# Patient Record
Sex: Female | Born: 1950
Health system: Southern US, Community
[De-identification: ages and names within clinical notes are randomized; demographics above are authoritative.]

## PROBLEM LIST (undated history)

## (undated) DIAGNOSIS — I639 Cerebral infarction, unspecified: Secondary | ICD-10-CM

## (undated) DIAGNOSIS — E162 Hypoglycemia, unspecified: Secondary | ICD-10-CM

## (undated) DIAGNOSIS — I10 Essential (primary) hypertension: Secondary | ICD-10-CM

## (undated) HISTORY — PX: ABDOMINAL HYSTERECTOMY: SHX81

## (undated) HISTORY — PX: HERNIA REPAIR: SHX51

## (undated) HISTORY — PX: TUBAL LIGATION: SHX77

---

## 2011-07-20 ENCOUNTER — Emergency Department (INDEPENDENT_AMBULATORY_CARE_PROVIDER_SITE_OTHER): Payer: No Typology Code available for payment source

## 2011-07-20 ENCOUNTER — Emergency Department (HOSPITAL_BASED_OUTPATIENT_CLINIC_OR_DEPARTMENT_OTHER)
Admission: EM | Admit: 2011-07-20 | Discharge: 2011-07-20 | Disposition: A | Discharge status: Home / Self Care | Payer: No Typology Code available for payment source | Attending: Emergency Medicine | Admitting: Emergency Medicine

## 2011-07-20 ENCOUNTER — Encounter: Payer: Self-pay | Admitting: *Deleted

## 2011-07-20 DIAGNOSIS — R109 Unspecified abdominal pain: Secondary | ICD-10-CM | POA: Insufficient documentation

## 2011-07-20 DIAGNOSIS — Y92410 Unspecified street and highway as the place of occurrence of the external cause: Secondary | ICD-10-CM | POA: Insufficient documentation

## 2011-07-20 DIAGNOSIS — M549 Dorsalgia, unspecified: Secondary | ICD-10-CM | POA: Insufficient documentation

## 2011-07-20 DIAGNOSIS — M25559 Pain in unspecified hip: Secondary | ICD-10-CM

## 2011-07-20 DIAGNOSIS — I10 Essential (primary) hypertension: Secondary | ICD-10-CM | POA: Insufficient documentation

## 2011-07-20 HISTORY — DX: Essential (primary) hypertension: I10
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
## 2011-07-23 ENCOUNTER — Ambulatory Visit (INDEPENDENT_AMBULATORY_CARE_PROVIDER_SITE_OTHER): Payer: No Typology Code available for payment source | Admitting: Family Medicine

## 2011-07-23 ENCOUNTER — Encounter: Payer: Self-pay | Admitting: Family Medicine

## 2011-07-23 ENCOUNTER — Ambulatory Visit (HOSPITAL_BASED_OUTPATIENT_CLINIC_OR_DEPARTMENT_OTHER)
Admission: RE | Admit: 2011-07-23 | Discharge: 2011-07-23 | Disposition: A | Discharge status: Home / Self Care | Payer: No Typology Code available for payment source | Source: Ambulatory Visit | Attending: Family Medicine | Admitting: Family Medicine

## 2011-07-23 DIAGNOSIS — M25571 Pain in right ankle and joints of right foot: Secondary | ICD-10-CM

## 2011-07-23 DIAGNOSIS — S93401A Sprain of unspecified ligament of right ankle, initial encounter: Secondary | ICD-10-CM

## 2011-07-23 DIAGNOSIS — M79641 Pain in right hand: Secondary | ICD-10-CM

## 2011-07-23 DIAGNOSIS — M79609 Pain in unspecified limb: Secondary | ICD-10-CM

## 2011-07-23 DIAGNOSIS — M25579 Pain in unspecified ankle and joints of unspecified foot: Secondary | ICD-10-CM

## 2011-07-23 DIAGNOSIS — M25476 Effusion, unspecified foot: Secondary | ICD-10-CM | POA: Insufficient documentation

## 2011-07-23 DIAGNOSIS — IMO0002 Reserved for concepts with insufficient information to code with codable children: Secondary | ICD-10-CM

## 2011-07-23 DIAGNOSIS — M7730 Calcaneal spur, unspecified foot: Secondary | ICD-10-CM | POA: Insufficient documentation

## 2011-07-23 DIAGNOSIS — M25539 Pain in unspecified wrist: Secondary | ICD-10-CM

## 2011-07-23 DIAGNOSIS — M25531 Pain in right wrist: Secondary | ICD-10-CM

## 2011-07-23 DIAGNOSIS — S93409A Sprain of unspecified ligament of unspecified ankle, initial encounter: Secondary | ICD-10-CM

## 2011-07-23 DIAGNOSIS — T07XXXA Unspecified multiple injuries, initial encounter: Secondary | ICD-10-CM

## 2011-07-23 DIAGNOSIS — M25473 Effusion, unspecified ankle: Secondary | ICD-10-CM | POA: Insufficient documentation

## 2011-07-23 DIAGNOSIS — S63509A Unspecified sprain of unspecified wrist, initial encounter: Secondary | ICD-10-CM

## 2011-07-23 MED ORDER — MELOXICAM 15 MG PO TABS: 15.0000 mg | ORAL_TABLET | Freq: Every day | ORAL | Status: AC

## 2011-07-23 MED ORDER — HYDROCODONE-ACETAMINOPHEN 5-500 MG PO TABS: 1.0000 | ORAL_TABLET | Freq: Four times a day (QID) | ORAL | Status: AC | PRN

## 2011-07-23 MED ORDER — CYCLOBENZAPRINE HCL 10 MG PO TABS: 10.0000 mg | ORAL_TABLET | Freq: Three times a day (TID) | ORAL | Status: AC | PRN
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
## 2011-07-24 ENCOUNTER — Encounter: Payer: Self-pay | Admitting: Family Medicine

## 2011-07-24 DIAGNOSIS — S93401A Sprain of unspecified ligament of right ankle, initial encounter: Secondary | ICD-10-CM | POA: Insufficient documentation

## 2011-07-24 DIAGNOSIS — S63509A Unspecified sprain of unspecified wrist, initial encounter: Secondary | ICD-10-CM | POA: Insufficient documentation

## 2011-07-24 DIAGNOSIS — T07XXXA Unspecified multiple injuries, initial encounter: Secondary | ICD-10-CM | POA: Insufficient documentation
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
## 2011-07-24 NOTE — Assessment & Plan Note (Signed)
x-rays of lumbar spine and left hip negative.  Muscle soreness throughout left side of back but primarily in cervical and lumbar regions.  Start physical therapy and home exercise program.  Tylenol, meloxicam for pain.  Flexeril at nighttime for muscle spasms.  Heat as well as needed.  Vicodin prn pain at bedtime.
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
## 2011-07-24 NOTE — Assessment & Plan Note (Signed)
mild - walking without a limp.  X-rays negative for fracture at lateral malleolus.  Will monitor - believe this is a grade 1 sprain - consider ASO, rehab for this as well if she doesn't improve as expected.  Mobic, icing.

## 2011-07-31 ENCOUNTER — Ambulatory Visit: Payer: No Typology Code available for payment source | Attending: Family Medicine | Admitting: Physical Therapy

## 2011-07-31 DIAGNOSIS — M545 Low back pain, unspecified: Secondary | ICD-10-CM | POA: Insufficient documentation

## 2011-07-31 DIAGNOSIS — IMO0001 Reserved for inherently not codable concepts without codable children: Secondary | ICD-10-CM | POA: Insufficient documentation

## 2011-07-31 DIAGNOSIS — M542 Cervicalgia: Secondary | ICD-10-CM | POA: Insufficient documentation

## 2012-03-18 ENCOUNTER — Emergency Department (HOSPITAL_BASED_OUTPATIENT_CLINIC_OR_DEPARTMENT_OTHER)
Admission: EM | Admit: 2012-03-18 | Discharge: 2012-03-18 | Disposition: A | Discharge status: Home / Self Care | Payer: Self-pay | Attending: Emergency Medicine | Admitting: Emergency Medicine

## 2012-03-18 ENCOUNTER — Encounter (HOSPITAL_BASED_OUTPATIENT_CLINIC_OR_DEPARTMENT_OTHER): Payer: Self-pay | Admitting: *Deleted

## 2012-03-18 DIAGNOSIS — N390 Urinary tract infection, site not specified: Secondary | ICD-10-CM

## 2012-03-18 DIAGNOSIS — IMO0001 Reserved for inherently not codable concepts without codable children: Secondary | ICD-10-CM | POA: Insufficient documentation

## 2012-03-18 DIAGNOSIS — I10 Essential (primary) hypertension: Secondary | ICD-10-CM | POA: Insufficient documentation

## 2012-03-18 DIAGNOSIS — M5410 Radiculopathy, site unspecified: Secondary | ICD-10-CM

## 2012-03-18 LAB — URINALYSIS, ROUTINE W REFLEX MICROSCOPIC
Bilirubin Urine: NEGATIVE
Hgb urine dipstick: NEGATIVE
Ketones, ur: NEGATIVE mg/dL
Nitrite: NEGATIVE
Protein, ur: NEGATIVE mg/dL
Urobilinogen, UA: 1 mg/dL (ref 0.0–1.0)

## 2012-03-18 LAB — URINE MICROSCOPIC-ADD ON

## 2012-03-18 MED ORDER — CIPROFLOXACIN HCL 250 MG PO TABS: 250.0000 mg | ORAL_TABLET | Freq: Two times a day (BID) | ORAL | Status: AC

## 2012-03-18 MED ORDER — PREDNISONE 10 MG PO TABS: 20.0000 mg | ORAL_TABLET | Freq: Two times a day (BID) | ORAL | Status: DC

## 2012-03-18 MED ORDER — HYDROCODONE-ACETAMINOPHEN 5-500 MG PO TABS: 1.0000 | ORAL_TABLET | Freq: Four times a day (QID) | ORAL | Status: AC | PRN

## 2012-03-18 NOTE — ED Notes (Signed)
Patient states she has a three week history of right lumbar back pain with radiation into legs, left leg worse than right.  States she has swelling in her left foot.  No known injury.  Using ibuprofen 800mg  tid and occasionally goody powder with minimal relief.

## 2012-03-18 NOTE — ED Provider Notes (Signed)
History     CSN: [id number]  Arrival date & time 03/18/12  1138   First MD Initiated Contact with Patient 03/18/12 1223      Chief Complaint  Patient presents with  . Back Pain  . Leg Pain  . Hematuria    (Consider location/radiation/quality/duration/timing/severity/associated sxs/prior treatment) Patient is a 61 y.o. female presenting with back pain. The history is provided by the patient.  Back Pain  This is a new problem. Episode onset: 2-3 weeks ago. The problem occurs constantly. The problem has been gradually worsening. The pain is associated with no known injury. The pain is present in the lumbar spine. The quality of the pain is described as stabbing. The pain radiates to the right thigh. The pain is moderate. The symptoms are aggravated by bending, twisting and certain positions. The pain is the same all the time. Pertinent negatives include no bowel incontinence, no bladder incontinence and no weakness. She has tried NSAIDs for the symptoms. The treatment provided no relief.    Past Medical History  Diagnosis Date  . Hypertension     Past Surgical History  Procedure Date  . Abdominal hysterectomy   . Hernia repair   . Tubal ligation     Family History  Problem Relation Age of Onset  . Hypertension Mother   . Diabetes Sister   . Hypertension Brother   . Hypertension Maternal Aunt   . Heart attack Neg Hx   . Hyperlipidemia Neg Hx   . Sudden death Neg Hx     History  Substance Use Topics  . Smoking status: Never Smoker   . Smokeless tobacco: Not on file  . Alcohol Use: No    OB History    Grav Para Term Preterm Abortions TAB SAB Ect Mult Living                  Review of Systems  Gastrointestinal: Negative for bowel incontinence.  Genitourinary: Negative for bladder incontinence.  Musculoskeletal: Positive for back pain.  Neurological: Negative for weakness.  All other systems reviewed and are negative.    Allergies  Review of patient's  allergies indicates no known allergies.  Home Medications   Current Outpatient Rx  Name Route Sig Dispense Refill  . NEBIVOLOL HCL 10 MG PO TABS Oral Take 10 mg by mouth daily.    Marland Kitchen CLONIDINE HCL 0.2 MG PO TABS Oral Take 0.2 mg by mouth 2 (two) times daily.      . CYCLOBENZAPRINE HCL 10 MG PO TABS Oral Take 1 tablet (10 mg total) by mouth every 8 (eight) hours as needed for muscle spasms. 60 tablet 1  . MELOXICAM 15 MG PO TABS Oral Take 1 tablet (15 mg total) by mouth daily. With food. 30 tablet 1  . OLMESARTAN MEDOXOMIL 20 MG PO TABS Oral Take 20 mg by mouth daily.        BP 183/90  Pulse 51  Temp 97.9 F (36.6 C) (Oral)  Resp 20  Ht 5\' 9"  (1.753 m)  Wt 250 lb (113.399 kg)  BMI 36.92 kg/m2  SpO2 100%  Physical Exam  Nursing note and vitals reviewed. Constitutional: She is oriented to person, place, and time. She appears well-developed and well-nourished. No distress.  HENT:  Head: Normocephalic and atraumatic.  Neck: Normal range of motion. Neck supple.  Abdominal: Soft. Bowel sounds are normal. She exhibits no distension. There is no tenderness.  Musculoskeletal: Normal range of motion.       There  is ttp of the right lumbar soft tissues.  Neurological: She is alert and oriented to person, place, and time.       DTR's are trace, but equal in the ble.  Strength is 5/5 in the ble.  Ambulatory without difficulty.  Skin: Skin is warm and dry. She is not diaphoretic.    ED Course  Procedures (including critical care time)   Labs Reviewed  URINALYSIS, ROUTINE W REFLEX MICROSCOPIC   No results found.   No diagnosis found.    MDM  The urine does show a mild uti, but my impression is that this is mainly musculoskeletal.  For that reason, I will treat for both.          Geoffery Lyons, MD 03/18/12 1250

## 2012-03-18 NOTE — Discharge Instructions (Signed)
Urinary Tract Infection Infections of the urinary tract can start in several places. A bladder infection (cystitis), a kidney infection (pyelonephritis), and a prostate infection (prostatitis) are different types of urinary tract infections (UTIs). They usually get better if treated with medicines (antibiotics) that kill germs. Take all the medicine until it is gone. You or your child may feel better in a few days, but TAKE ALL MEDICINE or the infection may not respond and may become more difficult to treat. HOME CARE INSTRUCTIONS   Drink enough water and fluids to keep the urine clear or pale yellow. Cranberry juice is especially recommended, in addition to large amounts of water.   Avoid caffeine, tea, and carbonated beverages. They tend to irritate the bladder.   Alcohol may irritate the prostate.   Only take over-the-counter or prescription medicines for pain, discomfort, or fever as directed by your caregiver.  To prevent further infections:  Empty the bladder often. Avoid holding urine for long periods of time.   After a bowel movement, women should cleanse from front to back. Use each tissue only once.   Empty the bladder before and after sexual intercourse.  FINDING OUT THE RESULTS OF YOUR TEST Not all test results are available during your visit. If your or your child's test results are not back during the visit, make an appointment with your caregiver to find out the results. Do not assume everything is normal if you have not heard from your caregiver or the medical facility. It is important for you to follow up on all test results. SEEK MEDICAL CARE IF:   There is back pain.   Your baby is older than 3 months with a rectal temperature of 100.5 F (38.1 C) or higher for more than 1 day.   Your or your child's problems (symptoms) are no better in 3 days. Return sooner if you or your child is getting worse.  SEEK IMMEDIATE MEDICAL CARE IF:   There is severe back pain or lower  abdominal pain.   You or your child develops chills.   You have a fever.   Your baby is older than 3 months with a rectal temperature of 102 F (38.9 C) or higher.   Your baby is 47 months old or younger with a rectal temperature of 100.4 F (38 C) or higher.   There is nausea or vomiting.   There is continued burning or discomfort with urination.  MAKE SURE YOU:   Understand these instructions.   Will watch your condition.   Will get help right away if you are not doing well or get worse.  Document Released: 06/25/2005 Document Revised: 09/04/2011 Document Reviewed: 01/28/2007 [facility] Patient Information 2012 [place], [place].Lumbosacral Radiculopathy Lumbosacral radiculopathy is a pinched nerve or nerves in the low back (lumbosacral area). When this happens you may have weakness in your legs and may not be able to stand on your toes. You may have pain going down into your legs. There may be difficulties with walking normally. There are many causes of this problem. Sometimes this may happen from an injury, or simply from arthritis or boney problems. It may also be caused by other illnesses such as diabetes. If there is no improvement after treatment, further studies may be done to find the exact cause. DIAGNOSIS  X-rays may be needed if the problems become long standing. Electromyograms may be done. This study is one in which the working of nerves and muscles is studied. HOME CARE INSTRUCTIONS   Applications  of ice packs may be helpful. Ice can be used in a plastic bag with a towel around it to prevent frostbite to skin. This may be used every 2 hours for 20 to 30 minutes, or as needed, while awake, or as directed by your caregiver.   Only take over-the-counter or prescription medicines for pain, discomfort, or fever as directed by your caregiver.   If physical therapy was prescribed, follow your caregiver's directions.  SEEK IMMEDIATE MEDICAL CARE IF:   You have pain not  controlled with medications.   You seem to be getting worse rather than better.   You develop increasing weakness in your legs.   You develop loss of bowel or bladder control.   You have difficulty with walking or balance, or develop clumsiness in the use of your legs.   You have a fever.  MAKE SURE YOU:   Understand these instructions.   Will watch your condition.   Will get help right away if you are not doing well or get worse.  Document Released: 09/15/2005 Document Revised: 09/04/2011 Document Reviewed: 05/05/2008 [facility] Patient Information 2012 [place], [place].

## 2012-03-18 NOTE — ED Notes (Signed)
Patient states 5 days ago she had blood on her tissue after urination and an increase in her back pain.

## 2014-03-27 ENCOUNTER — Observation Stay (HOSPITAL_COMMUNITY): Payer: No Typology Code available for payment source

## 2014-03-27 ENCOUNTER — Emergency Department (HOSPITAL_BASED_OUTPATIENT_CLINIC_OR_DEPARTMENT_OTHER): Payer: No Typology Code available for payment source

## 2014-03-27 ENCOUNTER — Encounter (HOSPITAL_BASED_OUTPATIENT_CLINIC_OR_DEPARTMENT_OTHER): Payer: Self-pay | Admitting: Emergency Medicine

## 2014-03-27 ENCOUNTER — Observation Stay (HOSPITAL_BASED_OUTPATIENT_CLINIC_OR_DEPARTMENT_OTHER)
Admission: EM | Admit: 2014-03-27 | Discharge: 2014-03-28 | Disposition: A | Discharge status: Home / Self Care | Payer: No Typology Code available for payment source | Attending: Internal Medicine | Admitting: Internal Medicine

## 2014-03-27 DIAGNOSIS — R202 Paresthesia of skin: Secondary | ICD-10-CM

## 2014-03-27 DIAGNOSIS — T07XXXA Unspecified multiple injuries, initial encounter: Secondary | ICD-10-CM

## 2014-03-27 DIAGNOSIS — N390 Urinary tract infection, site not specified: Secondary | ICD-10-CM | POA: Diagnosis present

## 2014-03-27 DIAGNOSIS — R519 Headache, unspecified: Secondary | ICD-10-CM | POA: Diagnosis present

## 2014-03-27 DIAGNOSIS — R209 Unspecified disturbances of skin sensation: Secondary | ICD-10-CM | POA: Insufficient documentation

## 2014-03-27 DIAGNOSIS — I160 Hypertensive urgency: Secondary | ICD-10-CM | POA: Diagnosis present

## 2014-03-27 DIAGNOSIS — M545 Low back pain, unspecified: Secondary | ICD-10-CM | POA: Diagnosis present

## 2014-03-27 DIAGNOSIS — R51 Headache: Secondary | ICD-10-CM

## 2014-03-27 DIAGNOSIS — I10 Essential (primary) hypertension: Principal | ICD-10-CM | POA: Insufficient documentation

## 2014-03-27 DIAGNOSIS — I517 Cardiomegaly: Secondary | ICD-10-CM

## 2014-03-27 DIAGNOSIS — R29898 Other symptoms and signs involving the musculoskeletal system: Secondary | ICD-10-CM | POA: Diagnosis present

## 2014-03-27 DIAGNOSIS — R200 Anesthesia of skin: Secondary | ICD-10-CM

## 2014-03-27 LAB — CBC WITH DIFFERENTIAL/PLATELET
BASOS ABS: 0 10*3/uL (ref 0.0–0.1)
BASOS PCT: 0 % (ref 0–1)
EOS PCT: 2 % (ref 0–5)
Eosinophils Absolute: 0.1 10*3/uL (ref 0.0–0.7)
HCT: 40.3 % (ref 36.0–46.0)
Hemoglobin: 13.8 g/dL (ref 12.0–15.0)
LYMPHS PCT: 32 % (ref 12–46)
Lymphs Abs: 2.3 10*3/uL (ref 0.7–4.0)
MCH: 31.2 pg (ref 26.0–34.0)
MCHC: 34.2 g/dL (ref 30.0–36.0)
MCV: 91 fL (ref 78.0–100.0)
Monocytes Absolute: 0.8 10*3/uL (ref 0.1–1.0)
Monocytes Relative: 11 % (ref 3–12)
NEUTROS ABS: 3.9 10*3/uL (ref 1.7–7.7)
Neutrophils Relative %: 55 % (ref 43–77)
PLATELETS: 185 10*3/uL (ref 150–400)
RBC: 4.43 MIL/uL (ref 3.87–5.11)
RDW: 13.9 % (ref 11.5–15.5)
WBC: 7.1 10*3/uL (ref 4.0–10.5)

## 2014-03-27 LAB — PROTIME-INR
INR: 0.9 (ref 0.00–1.49)
Prothrombin Time: 12.2 seconds (ref 11.6–15.2)

## 2014-03-27 LAB — RAPID URINE DRUG SCREEN, HOSP PERFORMED
Amphetamines: NOT DETECTED
Barbiturates: NOT DETECTED
Benzodiazepines: NOT DETECTED
Cocaine: NOT DETECTED
Opiates: NOT DETECTED
Tetrahydrocannabinol: NOT DETECTED

## 2014-03-27 LAB — URINALYSIS, ROUTINE W REFLEX MICROSCOPIC
BILIRUBIN URINE: NEGATIVE
Glucose, UA: NEGATIVE mg/dL
Hgb urine dipstick: NEGATIVE
Ketones, ur: NEGATIVE mg/dL
NITRITE: NEGATIVE
PH: 6.5 (ref 5.0–8.0)
Protein, ur: NEGATIVE mg/dL
SPECIFIC GRAVITY, URINE: 1.015 (ref 1.005–1.030)
UROBILINOGEN UA: 1 mg/dL (ref 0.0–1.0)

## 2014-03-27 LAB — BASIC METABOLIC PANEL
BUN: 17 mg/dL (ref 6–23)
CHLORIDE: 105 meq/L (ref 96–112)
CO2: 26 meq/L (ref 19–32)
CREATININE: 0.68 mg/dL (ref 0.50–1.10)
Calcium: 8.8 mg/dL (ref 8.4–10.5)
GFR calc Af Amer: >90 mL/min (ref >90)
GFR calc non Af Amer: >90 mL/min (ref >90)
Glucose, Bld: 100 mg/dL — ABNORMAL HIGH (ref 70–99)
Potassium: 4.1 mEq/L (ref 3.7–5.3)
Sodium: 143 mEq/L (ref 137–147)

## 2014-03-27 LAB — CBC
HEMATOCRIT: 40.9 % (ref 36.0–46.0)
Hemoglobin: 13.4 g/dL (ref 12.0–15.0)
MCH: 30.5 pg (ref 26.0–34.0)
MCHC: 32.8 g/dL (ref 30.0–36.0)
MCV: 93 fL (ref 78.0–100.0)
PLATELETS: 151 10*3/uL (ref 150–400)
RBC: 4.4 MIL/uL (ref 3.87–5.11)
RDW: 14.2 % (ref 11.5–15.5)
WBC: 5.8 10*3/uL (ref 4.0–10.5)

## 2014-03-27 LAB — COMPREHENSIVE METABOLIC PANEL
ALBUMIN: 3.7 g/dL (ref 3.5–5.2)
ALT: 19 U/L (ref 0–35)
AST: 26 U/L (ref 0–37)
Alkaline Phosphatase: 68 U/L (ref 39–117)
BUN: 19 mg/dL (ref 6–23)
CALCIUM: 9.6 mg/dL (ref 8.4–10.5)
CO2: 29 meq/L (ref 19–32)
Chloride: 107 mEq/L (ref 96–112)
Creatinine, Ser: 0.8 mg/dL (ref 0.50–1.10)
GFR calc Af Amer: >90 mL/min (ref >90)
GFR, EST NON AFRICAN AMERICAN: 78 mL/min — AB (ref >90)
Glucose, Bld: 112 mg/dL — ABNORMAL HIGH (ref 70–99)
Potassium: 4.2 mEq/L (ref 3.7–5.3)
SODIUM: 146 meq/L (ref 137–147)
Total Bilirubin: 0.3 mg/dL (ref 0.3–1.2)
Total Protein: 7.4 g/dL (ref 6.0–8.3)

## 2014-03-27 LAB — URINE MICROSCOPIC-ADD ON

## 2014-03-27 LAB — TROPONIN I

## 2014-03-27 LAB — HEMOGLOBIN A1C
HEMOGLOBIN A1C: 6 % — AB (ref <5.7)
MEAN PLASMA GLUCOSE: 126 mg/dL — AB (ref <117)

## 2014-03-27 LAB — VITAMIN B12: VITAMIN B 12: 656 pg/mL (ref 211–911)

## 2014-03-27 MED ORDER — ACETAMINOPHEN 325 MG PO TABS
650.0000 mg | ORAL_TABLET | Freq: Once | ORAL | Status: AC
  Administered 2014-03-27: 650 mg via ORAL
  Filled 2014-03-27: qty 2

## 2014-03-27 MED ORDER — HYDRALAZINE HCL 20 MG/ML IJ SOLN: 10.0000 mg | Freq: Four times a day (QID) | INTRAMUSCULAR | Status: DC | PRN

## 2014-03-27 MED ORDER — NEBIVOLOL HCL 5 MG PO TABS
5.0000 mg | ORAL_TABLET | Freq: Every day | ORAL | Status: DC
  Administered 2014-03-27 – 2014-03-28 (×2): 5 mg via ORAL
  Filled 2014-03-27 (×2): qty 1

## 2014-03-27 MED ORDER — ASPIRIN 81 MG PO CHEW
324.0000 mg | CHEWABLE_TABLET | Freq: Once | ORAL | Status: AC
  Administered 2014-03-27: 324 mg via ORAL
  Filled 2014-03-27: qty 4

## 2014-03-27 MED ORDER — CIPROFLOXACIN HCL 250 MG PO TABS
250.0000 mg | ORAL_TABLET | Freq: Two times a day (BID) | ORAL | Status: DC
  Administered 2014-03-27 – 2014-03-28 (×3): 250 mg via ORAL
  Filled 2014-03-27 (×5): qty 1

## 2014-03-27 MED ORDER — LISINOPRIL 10 MG PO TABS
10.0000 mg | ORAL_TABLET | Freq: Every day | ORAL | Status: DC
  Filled 2014-03-27: qty 1

## 2014-03-27 MED ORDER — ASPIRIN 325 MG PO TABS
325.0000 mg | ORAL_TABLET | Freq: Every day | ORAL | Status: DC
  Administered 2014-03-27 – 2014-03-28 (×2): 325 mg via ORAL
  Filled 2014-03-27 (×2): qty 1

## 2014-03-27 MED ORDER — ENOXAPARIN SODIUM 40 MG/0.4ML ~~LOC~~ SOLN
40.0000 mg | Freq: Every day | SUBCUTANEOUS | Status: DC
  Administered 2014-03-27 – 2014-03-28 (×2): 40 mg via SUBCUTANEOUS
  Filled 2014-03-27 (×2): qty 0.4

## 2014-03-27 MED ORDER — LISINOPRIL 20 MG PO TABS
20.0000 mg | ORAL_TABLET | Freq: Every day | ORAL | Status: DC
  Administered 2014-03-27 – 2014-03-28 (×2): 20 mg via ORAL
  Filled 2014-03-27 (×2): qty 1

## 2014-03-27 MED ORDER — ACETAMINOPHEN 325 MG PO TABS
650.0000 mg | ORAL_TABLET | ORAL | Status: DC | PRN
  Administered 2014-03-27 – 2014-03-28 (×3): 650 mg via ORAL
  Filled 2014-03-27 (×4): qty 2

## 2014-03-27 MED ORDER — CEFTRIAXONE SODIUM 1 G IJ SOLR
INTRAMUSCULAR | Status: AC
  Filled 2014-03-27: qty 10

## 2014-03-27 MED ORDER — AMLODIPINE BESYLATE 5 MG PO TABS
5.0000 mg | ORAL_TABLET | Freq: Every day | ORAL | Status: DC
  Administered 2014-03-27 – 2014-03-28 (×2): 5 mg via ORAL
  Filled 2014-03-27 (×2): qty 1

## 2014-03-27 MED ORDER — DEXTROSE 5 % IV SOLN
1.0000 g | Freq: Once | INTRAVENOUS | Status: AC
  Administered 2014-03-27: 1 g via INTRAVENOUS

## 2014-03-27 MED ORDER — SODIUM CHLORIDE 0.9 % IV SOLN: INTRAVENOUS | Status: DC

## 2014-03-27 MED ORDER — HYDRALAZINE HCL 20 MG/ML IJ SOLN
2.0000 mg | Freq: Once | INTRAMUSCULAR | Status: AC
  Administered 2014-03-27: 2 mg via INTRAVENOUS
  Filled 2014-03-27: qty 1

## 2014-03-27 NOTE — H&P (Signed)
Patient's PCP: No PCP Per Patient, has seen [facility] in the past.  Chief Complaint: Left upper extremity weakness, headache, and low back pain  History of Present Illness: Olivia [name] is a 63 y.o. African American female is history of hypertension but not compliant with her medications at times who presents with the above complaints.  Patient reports that her symptoms started on 03/24/2014 when she traveled with her husband to [place], [place] for a reunion.  On the way there, she noted that she was having headache which was "sizzling."  She also was noted to have left upper extremity weakness and numbness.  The symptoms in her left upper extremity have been intermittent.  Initially she did not make much of the symptoms however as the weekend progressed her symptoms had been getting worse.  She initially took some Motrin which relieved her headache, but she still had persistent left upper extremity weakness and numbness.  She and her husband came back yesterday to [place], [place].  Due to ongoing symptoms, she rested for a period of time.  However when she woke up during the night at 11 p.m. she again had left arm numbness and weakness which has been worse than in the past as a result she was brought to the emergency department for further evaluation.  In the emergency department she was found to be hypertensive with a blood pressure of 202/108.  Head CT did not show any acute intracranial process.  Chest x-ray was negative.  She did have the possible urinary tract infection on UA.  Hospitalist service was asked to admit the patient for further care and management.  She denies any recent fevers, chills, nausea, vomiting, chest pain, shortness of breath, abdominal pain, diarrhea, or vision changes.  Currently the left upper extremity weakness and numbness has improved.  She only indicates that she has some numbness and tingling in her left hand fingertips.  Review of Systems: All systems reviewed with  the patient and positive as per history of present illness, otherwise all other systems are negative.  Past Medical History  Diagnosis Date  . Hypertension    Past Surgical History  Procedure Laterality Date  . Abdominal hysterectomy    . Hernia repair    . Tubal ligation     Family History  Problem Relation Age of Onset  . Hypertension Mother   . Diabetes Sister   . Hypertension Brother   . Hypertension Maternal Aunt   . Heart attack Neg Hx   . Hyperlipidemia Neg Hx   . Sudden death Neg Hx    History   Social History  . Marital Status: Married    Spouse Name: N/A    Number of Children: N/A  . Years of Education: N/A   Occupational History  . Not on file.   Social History Main Topics  . Smoking status: Never Smoker   . Smokeless tobacco: Not on file  . Alcohol Use: No  . Drug Use: No  . Sexual Activity: Not on file   Other Topics Concern  . Not on file   Social History Narrative  . No narrative on file   Allergies: Review of patient's allergies indicates no known allergies.  Home Meds: Prior to Admission medications   Medication Sig Start Date End Date Taking? Authorizing [employer]  lisinopril (PRINIVIL,ZESTRIL) 10 MG tablet Take 10 mg by mouth daily.   Yes Historical Jennife Zaucha, MD  cloNIDine (CATAPRES) 0.2 MG tablet Take 0.2 mg by mouth 2 (two) times daily.  Historical Yossef Gilkison, MD  nebivolol (BYSTOLIC) 10 MG tablet Take 10 mg by mouth daily.    Historical Ashten Sarnowski, MD  olmesartan (BENICAR) 20 MG tablet Take 20 mg by mouth daily.     Historical Azuri Bozard, MD  predniSONE (DELTASONE) 10 MG tablet Take 2 tablets (20 mg total) by mouth 2 (two) times daily. 03/18/12   Geoffery Lyons, MD    Physical Exam: Blood pressure 179/73, pulse 65, temperature 98 F (36.7 C), temperature source Oral, resp. rate 17, height 5' 9.5" (1.765 m), weight 102.059 kg (225 lb), SpO2 100.00%. General: Awake, Oriented x3, No acute distress. HEENT: EOMI, Moist mucous membranes Neck:  Supple CV: S1 and S2 Lungs: Clear to ascultation bilaterally Abdomen: Soft, Nontender, Nondistended, +bowel sounds. Ext: Good pulses. Trace edema. No clubbing or cyanosis noted. Neuro: Cranial Nerves II-XII grossly intact. Has 5/5 motor strength in upper and lower extremities.  Lab results:  Recent Labs  03/27/14 0100  NA 146  K 4.2  CL 107  CO2 29  GLUCOSE 112*  BUN 19  CREATININE 0.80  CALCIUM 9.6    Recent Labs  03/27/14 0100  AST 26  ALT 19  ALKPHOS 68  BILITOT 0.3  PROT 7.4  ALBUMIN 3.7   No results found for this basename: LIPASE, AMYLASE,  in the last 72 hours  Recent Labs  03/27/14 0100  WBC 7.1  NEUTROABS 3.9  HGB 13.8  HCT 40.3  MCV 91.0  PLT 185    Recent Labs  03/27/14 0100  TROPONINI <0.30   No components found with this basename: POCBNP,  No results found for this basename: DDIMER,  in the last 72 hours No results found for this basename: HGBA1C,  in the last 72 hours No results found for this basename: CHOL, HDL, LDLCALC, TRIG, CHOLHDL, LDLDIRECT,  in the last 72 hours No results found for this basename: TSH, T4TOTAL, FREET3, T3FREE, THYROIDAB,  in the last 72 hours No results found for this basename: VITAMINB12, FOLATE, FERRITIN, TIBC, IRON, RETICCTPCT,  in the last 72 hours Imaging results:  Dg Chest 2 View  03/27/2014   CLINICAL DATA:  Headache  EXAM: CHEST  2 VIEW  COMPARISON:  None currently available  FINDINGS: Borderline cardiomegaly. Negative upper mediastinal contours. Right heart border is not well visualized, but there is no definite consolidation in the lateral projection. No edema, effusion, or pneumothorax. Negative osseous structures.  IMPRESSION: Right middle lobe opacity which favors atelectasis.   Electronically Signed   By: Tiburcio Pea M.D.   On: 03/27/2014 02:11   Ct Head Wo Contrast  03/27/2014   CLINICAL DATA:  Severe headache since Friday, left arm numbness.  EXAM: CT HEAD WITHOUT CONTRAST  TECHNIQUE: Contiguous  axial images were obtained from the base of the skull through the vertex without intravenous contrast.  COMPARISON:  None.  FINDINGS: The ventricles and sulci are normal. No intraparenchymal hemorrhage, mass effect nor midline shift. No acute large vascular territory infarcts.  No abnormal extra-axial fluid collections. Basal cisterns are patent.  No skull fracture. The included ocular globes and orbital contents are non-suspicious. The mastoid aircells and included paranasal sinuses are well-aerated. Soft tissue within the bilateral external auditory canals likely reflects cerumen.  IMPRESSION: No acute intracranial process ; normal noncontrast CT of the head for age.   Electronically Signed   By: Awilda Metro   On: 03/27/2014 02:23   Other results: EKG: Normal sinus rhythm with heart rate of 81.  Assessment & Plan by Problem:  Left upper extremity weakness and numbness Unclear etiology.  Maybe due to malignant hypertension versus TIA versus migraine.  Admit the patient to telemetry, start TIA workup.  Will get an MRI of the brain, carotid Dopplers, and 2-D echocardiogram.  Continue aspirin.  Head CT was negative.  Continue neuro checks.  If MRI of the brain is negative and if the patient has persistent symptoms, may consider neurology consultation.  Malignant hypertension Continue the patient on lisinopril.  Added amlodipine to her regimen.  When necessary hydralazine.  Will need close followup with primary care physician after discharge.  Headache Improved.  Tylenol as needed.  Uncertain if uncontrolled hypertension caused or may have been the result of the headache.  Urinary tract infection Patient received ceftriaxone in the emergency department, transition to 3 day course of ciprofloxacin.  Low back pain  Improved.  Continue to monitor.  Prophylaxis Lovenox.  CODE STATUS Full code.  Disposition Admit the patient to telemetry as observation.  Time spent on admission, talking to  the patient, and coordinating care was: 50 mins.  REDDY,SRIKAR A, MD 03/27/2014, 5:16 AM

## 2014-03-27 NOTE — ED Notes (Signed)
Report to Bobby, RN, to assume care of patient at this time.  

## 2014-03-27 NOTE — ED Notes (Signed)
Pt presents with severe hadache since Friday, also with left arm numbness

## 2014-03-27 NOTE — Progress Notes (Signed)
[facility] PROGRESS NOTE  Olivia [name] AVW:[id number] DOB: 06/28/1951 DOA: 03/27/2014 PCP: No PCP Per Patient  Assessment/Plan: 63 y.o. female with PMH of hypertension but not compliant with her medications at times who presents with the headaches, associated with left upper extremity weakness and hand numbness for several days;   1. L UE paraesthesia; r/o TIA vs complicated migraine or peripheral neuropathy;  -CT: unremarkable; neuro exam no focal; pend MRI, carotid US, echo; start ASA; check B12  2. HTN urgency; Likely due to non adherence; BP improving  -restarted BP meds, added amlodipine, resume bystolic; cont lisinopril  . Titrate per response  3. Headaches, reports migraines, ? Related to her work (Pt works at night) -cont pain control; afebrile; no focal exam  4. UTI, started atx; afebrile, no leukocytosis; cont monitoring     Code Status: full Family Communication:  D/w patient, her husband  (indicate person spoken with, relationship, and if by phone, the number) Disposition Plan: home 24-48 hrs    Consultants:  none  Procedures:  Echo pend   Antibiotics:  cipro (indicate start date, and stop date if known)  HPI/Subjective: alert  Objective: Filed Vitals:   03/27/14 0606  BP: 162/86  Pulse:   Temp:   Resp:    No intake or output data in the 24 hours ending 03/27/14 0830 Filed Weights   03/27/14 0108  Weight: 102.059 kg (225 lb)    Exam:   General:  aler  Cardiovascular: s1,s2 rrr  Respiratory: CTA BL  Abdomen: soft, nt,nd   Musculoskeletal: no LE edeam   Data Reviewed: Basic Metabolic Panel:  Recent Labs Lab 03/27/14 0100 03/27/14 0541  NA 146 143  K 4.2 4.1  CL 107 105  CO2 29 26  GLUCOSE 112* 100*  BUN 19 17  CREATININE 0.80 0.68  CALCIUM 9.6 8.8   Liver Function Tests:  Recent Labs Lab 03/27/14 0100  AST 26  ALT 19  ALKPHOS 68  BILITOT 0.3  PROT 7.4  ALBUMIN 3.7   No results found for this basename:  LIPASE, AMYLASE,  in the last 168 hours No results found for this basename: AMMONIA,  in the last 168 hours CBC:  Recent Labs Lab 03/27/14 0100 03/27/14 0541  WBC 7.1 5.8  NEUTROABS 3.9  --   HGB 13.8 13.4  HCT 40.3 40.9  MCV 91.0 93.0  PLT 185 151   Cardiac Enzymes:  Recent Labs Lab 03/27/14 0100  TROPONINI <0.30   BNP (last 3 results) No results found for this basename: PROBNP,  in the last 8760 hours CBG: No results found for this basename: GLUCAP,  in the last 168 hours  No results found for this or any previous visit (from the past 240 hour(s)).   Studies: Dg Chest 2 View  03/27/2014   CLINICAL DATA:  Headache  EXAM: CHEST  2 VIEW  COMPARISON:  None currently available  FINDINGS: Borderline cardiomegaly. Negative upper mediastinal contours. Right heart border is not well visualized, but there is no definite consolidation in the lateral projection. No edema, effusion, or pneumothorax. Negative osseous structures.  IMPRESSION: Right middle lobe opacity which favors atelectasis.   Electronically Signed   By: Tiburcio PeaJonathan  Watts M.D.   On: 03/27/2014 02:11   Ct Head Wo Contrast  03/27/2014   CLINICAL DATA:  Severe headache since Friday, left arm numbness.  EXAM: CT HEAD WITHOUT CONTRAST  TECHNIQUE: Contiguous axial images were obtained from the base of the skull through the vertex without intravenous  contrast.  COMPARISON:  None.  FINDINGS: The ventricles and sulci are normal. No intraparenchymal hemorrhage, mass effect nor midline shift. No acute large vascular territory infarcts.  No abnormal extra-axial fluid collections. Basal cisterns are patent.  No skull fracture. The included ocular globes and orbital contents are non-suspicious. The mastoid aircells and included paranasal sinuses are well-aerated. Soft tissue within the bilateral external auditory canals likely reflects cerumen.  IMPRESSION: No acute intracranial process ; normal noncontrast CT of the head for age.    Electronically Signed   By: Awilda Metroourtnay  Bloomer   On: 03/27/2014 02:23    Scheduled Meds: . amLODipine  5 mg Oral Daily  . aspirin  325 mg Oral Daily  . ciprofloxacin  250 mg Oral BID  . enoxaparin (LOVENOX) injection  40 mg Subcutaneous Daily  . lisinopril  10 mg Oral Daily   Continuous Infusions: . sodium chloride      Principal Problem:   Left arm weakness Active Problems:   Hypertensive urgency   Headache(784.0)   UTI (urinary tract infection)   Low back pain    Time spent: >35 minutes     Esperanza SheetsBURIEV, ULUGBEK N  [facility] Pager [phone]. If 7PM-7AM, please contact night-coverage at [email or web address], password [email or web address] 03/27/2014, 8:30 AM  LOS: 0 days

## 2014-03-27 NOTE — Progress Notes (Signed)
VASCULAR LAB PRELIMINARY  PRELIMINARY  PRELIMINARY  PRELIMINARY  Carotid Dopplers completed.    Preliminary report:  1-39% ICA stenosis.  Vertebral artery flow is antegrade.  Shelsie Tijerino, RVT 03/27/2014, 12:06 PM

## 2014-03-27 NOTE — ED Notes (Signed)
Pt reports left side weakness, per spouse her last known normal was 2100 6/28,

## 2014-03-27 NOTE — Progress Notes (Signed)
  Echocardiogram 2D Echocardiogram has been performed.  GREGORY, ANGELA 03/27/2014, 11:27 AM

## 2014-03-27 NOTE — ED Provider Notes (Signed)
CSN: [id number]     Arrival date & time 03/27/14  0034 History   First MD Initiated Contact with Patient 03/27/14 0136     Chief Complaint  Patient presents with  . Headache    An emergency department physician performed an initial assessment on this suspected stroke patient at [id number]. (Consider location/radiation/quality/duration/timing/severity/associated sxs/prior Treatment) Patient is a 63 y.o. female presenting with headaches. The history is provided by the patient and the spouse.  Headache Pain location:  Generalized Quality: sizzling. Radiates to:  Does not radiate Severity currently:  10/10 Severity at highest:  10/10 Onset quality:  Gradual Duration:  4 days Timing:  Constant Progression:  Unchanged Context: not activity and not loud noise   Relieved by:  Nothing Worsened by:  Nothing tried Ineffective treatments:  None tried Associated symptoms: numbness and paresthesias   Associated symptoms: no abdominal pain, no cough, no diarrhea, no dizziness, no fever, no myalgias, no neck pain, no neck stiffness, no photophobia, no seizures, no visual change, no vomiting and no weakness   Associated symptoms comment:  Back pain unchanged for > 6 months and urinary frequency Risk factors: no anger   Has not seen a doctor since 2014 when she had an MRI of lower back for pain which was reportedly normal.  Is only taking lisinopril for her BP and has missed several doses  Past Medical History  Diagnosis Date  . Hypertension    Past Surgical History  Procedure Laterality Date  . Abdominal hysterectomy    . Hernia repair    . Tubal ligation     Family History  Problem Relation Age of Onset  . Hypertension Mother   . Diabetes Sister   . Hypertension Brother   . Hypertension Maternal Aunt   . Heart attack Neg Hx   . Hyperlipidemia Neg Hx   . Sudden death Neg Hx    History  Substance Use Topics  . Smoking status: Never Smoker   . Smokeless tobacco: Not on file  . Alcohol  Use: No   OB History   Grav Para Term Preterm Abortions TAB SAB Ect Mult Living                 Review of Systems  Constitutional: Negative for fever.  Eyes: Negative for photophobia.  Respiratory: Negative for cough, choking and stridor.   Cardiovascular: Negative for chest pain, palpitations and leg swelling.  Gastrointestinal: Negative for vomiting, abdominal pain, diarrhea and constipation.  Genitourinary: Positive for frequency. Negative for dysuria and flank pain.  Musculoskeletal: Negative for myalgias, neck pain and neck stiffness.  Neurological: Positive for numbness, headaches and paresthesias. Negative for dizziness, tremors, seizures, syncope, facial asymmetry and light-headedness.  All other systems reviewed and are negative.     Allergies  Review of patient's allergies indicates no known allergies.  Home Medications   Prior to Admission medications   Medication Sig Start Date End Date Taking? Authorizing Avrom Robarts  lisinopril (PRINIVIL,ZESTRIL) 10 MG tablet Take 10 mg by mouth daily.   Yes Historical Robyn Galati, MD  cloNIDine (CATAPRES) 0.2 MG tablet Take 0.2 mg by mouth 2 (two) times daily.      Historical Jahari Billy, MD  nebivolol (BYSTOLIC) 10 MG tablet Take 10 mg by mouth daily.    Historical Lesly Joslyn, MD  olmesartan (BENICAR) 20 MG tablet Take 20 mg by mouth daily.      Historical Hazle Ogburn, MD  predniSONE (DELTASONE) 10 MG tablet Take 2 tablets (20 mg total) by mouth 2 (two)  times daily. 03/18/12   Geoffery Lyonsouglas Delo, MD   BP 182/98  Pulse 63  Temp(Src) 98 F (36.7 C) (Oral)  Resp 21  Ht 5' 9.5" (1.765 m)  Wt 225 lb (102.059 kg)  BMI 32.76 kg/m2  SpO2 100% Physical Exam  Constitutional: She is oriented to person, place, and time. She appears well-developed and well-nourished. No distress.  HENT:  Head: Normocephalic and atraumatic.  Mouth/Throat: Oropharynx is clear and moist. No oropharyngeal exudate.  Eyes: Conjunctivae and EOM are normal. Pupils are equal,  round, and reactive to light.  Neck: Normal range of motion. Neck supple. No JVD present.  Cardiovascular: Normal rate, regular rhythm and intact distal pulses.   Pulmonary/Chest: Effort normal and breath sounds normal. No stridor. No respiratory distress. She has no wheezes. She has no rales.  Abdominal: Soft. Bowel sounds are normal. There is no tenderness. There is no rebound and no guarding.  Musculoskeletal: Normal range of motion. She exhibits no edema.  Neurological: She is alert and oriented to person, place, and time. She has normal reflexes. She displays normal reflexes. No cranial nerve deficit. She exhibits normal muscle tone.  5/5 motor x 4 extremities.  Sensation intact to all nerve distributions of the LUE.    Skin: Skin is warm and dry.  Psychiatric: She has a normal mood and affect.    ED Course  Procedures (including critical care time) Labs Review Labs Reviewed  COMPREHENSIVE METABOLIC PANEL - Abnormal; Notable for the following:    Glucose, Bld 112 (*)    GFR calc non Af Amer 78 (*)    All other components within normal limits  URINALYSIS, ROUTINE W REFLEX MICROSCOPIC - Abnormal; Notable for the following:    APPearance HAZY (*)    Leukocytes, UA LARGE (*)    All other components within normal limits  URINE MICROSCOPIC-ADD ON - Abnormal; Notable for the following:    Squamous Epithelial / LPF FEW (*)    Bacteria, UA MANY (*)    All other components within normal limits  CBC WITH DIFFERENTIAL  PROTIME-INR  TROPONIN I    Imaging Review Dg Chest 2 View  03/27/2014   CLINICAL DATA:  Headache  EXAM: CHEST  2 VIEW  COMPARISON:  None currently available  FINDINGS: Borderline cardiomegaly. Negative upper mediastinal contours. Right heart border is not well visualized, but there is no definite consolidation in the lateral projection. No edema, effusion, or pneumothorax. Negative osseous structures.  IMPRESSION: Right middle lobe opacity which favors atelectasis.    Electronically Signed   By: Tiburcio PeaJonathan  Watts M.D.   On: 03/27/2014 02:11   Ct Head Wo Contrast  03/27/2014   CLINICAL DATA:  Severe headache since Friday, left arm numbness.  EXAM: CT HEAD WITHOUT CONTRAST  TECHNIQUE: Contiguous axial images were obtained from the base of the skull through the vertex without intravenous contrast.  COMPARISON:  None.  FINDINGS: The ventricles and sulci are normal. No intraparenchymal hemorrhage, mass effect nor midline shift. No acute large vascular territory infarcts.  No abnormal extra-axial fluid collections. Basal cisterns are patent.  No skull fracture. The included ocular globes and orbital contents are non-suspicious. The mastoid aircells and included paranasal sinuses are well-aerated. Soft tissue within the bilateral external auditory canals likely reflects cerumen.  IMPRESSION: No acute intracranial process ; normal noncontrast CT of the head for age.   Electronically Signed   By: Awilda Metroourtnay  Bloomer   On: 03/27/2014 02:23     EKG Interpretation   Date/Time:  Monday March 27 2014 00:52:07 EDT Ventricular Rate:  81 PR Interval:  190 QRS Duration: 88 QT Interval:  402 QTC Calculation: 466 R Axis:   -32 Text Interpretation:  Normal sinus rhythm Possible Left atrial enlargement  Left axis deviation Left ventricular hypertrophy with repolarization  abnormality Anteroseptal infarct , age undetermined Confirmed by  [facility]  MD, APRIL ([id number]) on 03/27/2014 1:12:45 AM      MDM   Final diagnoses:  Hypertensive urgency  Numbness and tingling  Headache disorder    Case d/w Dr. Amada Jupiter of neurology, suspects migraine with atypia but obtain MRI of the brain, if negative no further work up necessary if positive consult neurology.      Jasmine Awe, MD 03/27/14 [phone]

## 2014-03-27 NOTE — ED Notes (Signed)
Pt reports left arm weakness with pain in left arm

## 2014-03-27 NOTE — ED Notes (Signed)
I met patient in waiting room, then started vitals. Patient stated she had back,neck,shoulder and left arm pain and numbness. I started vitals, then got ecg while waiting for BP. Not able to get a BP in triage, automatically or manually. I gave ECG printout to nurse and took patient to room. Due to patient arm diameter, still was unable to get a manual BP with wall equipment. We switched back to auto and got BP of 229/122.

## 2014-03-28 DIAGNOSIS — R51 Headache: Secondary | ICD-10-CM

## 2014-03-28 DIAGNOSIS — N390 Urinary tract infection, site not specified: Secondary | ICD-10-CM

## 2014-03-28 LAB — LIPID PANEL
CHOL/HDL RATIO: 2.9 ratio
Cholesterol: 152 mg/dL (ref 0–200)
HDL: 53 mg/dL (ref >39)
LDL Cholesterol: 86 mg/dL (ref 0–99)
TRIGLYCERIDES: 67 mg/dL (ref <150)
VLDL: 13 mg/dL (ref 0–40)

## 2014-03-28 LAB — GLUCOSE, CAPILLARY: Glucose-Capillary: 79 mg/dL (ref 70–99)

## 2014-03-28 MED ORDER — NEBIVOLOL HCL 5 MG PO TABS: 5.0000 mg | ORAL_TABLET | Freq: Every day | ORAL | Status: DC

## 2014-03-28 MED ORDER — TRAMADOL HCL 50 MG PO TABS: 50.0000 mg | ORAL_TABLET | Freq: Four times a day (QID) | ORAL | Status: DC | PRN

## 2014-03-28 MED ORDER — LISINOPRIL 20 MG PO TABS: 20.0000 mg | ORAL_TABLET | Freq: Every day | ORAL | Status: DC

## 2014-03-28 MED ORDER — LEVOFLOXACIN 500 MG PO TABS: 500.0000 mg | ORAL_TABLET | Freq: Every day | ORAL | Status: DC

## 2014-03-28 MED ORDER — ASPIRIN EC 81 MG PO TBEC: 81.0000 mg | DELAYED_RELEASE_TABLET | Freq: Every day | ORAL | Status: AC

## 2014-03-28 MED ORDER — AMLODIPINE BESYLATE 5 MG PO TABS: 5.0000 mg | ORAL_TABLET | Freq: Every day | ORAL | Status: AC

## 2014-03-28 NOTE — Discharge Summary (Signed)
Physician Discharge Summary  Olivia [name] NWG:[id number] DOB: 08/13/1951 DOA: 03/27/2014  PCP: No PCP Per Patient  Admit date: 03/27/2014 Discharge date: 03/28/2014  Time spent: >35 minutes  Recommendations for Outpatient Follow-up:  F/u with PCP in 1 week  Discharge Diagnoses:  Principal Problem:   Left arm weakness Active Problems:   Hypertensive urgency   Headache(784.0)   UTI (urinary tract infection)   Low back pain   Discharge Condition: stable   Diet recommendation: low sodium   Filed Weights   03/27/14 0108 03/28/14 0500  Weight: 102.059 kg (225 lb) 118.978 kg (262 lb 4.8 oz)    History of present illness:  63 y.o. female with PMH of hypertension but not compliant with her medications at times who presents with the headaches, associated with left upper extremity weakness and hand numbness for several days;   Hospital Course:  1. L UE paraesthesia; ? complicated migraine or peripheral neuropathy;  -symptoms resolved; CT: unremarkable; neuro exam no focal;  -MRI:No acute or subacute infarction. Mild chronic appearing small vessel  change of the cerebral hemispheric white matter. Normal intracranial MR angiography -carotid US: Preliminary report: 1-39% ICA stenosis. Vertebral artery flow is antegrade. Echo: no clear emboli;  -started ASA; cont pain control; recommended to f/u with neurologist as outpatient for headaches if recurrent  2. HTN urgency; Likely due to non adherence; BP improving  -restarted BP meds, added amlodipine, resume bystolic; increased lisinopril . Titrate per response as OP -Pt reports working night shift and not taking meds on time  -echo: LVH, LVEF 60-65%, Pt is euvolemic, not in distress; cotn OP follow up  3. Headaches, reports migraines, ? Related to her work (Pt works at night)  -cont pain control; afebrile; no focal exam; MRI is unremarkable symptoms improved, Pt is comfortable; cont Op follow up  4. UTI, started atx; afebrile, no  leukocytosis; cont OP treatment, and follow up      Echo Study Conclusions  - Left ventricle: The cavity size was normal. Wall thickness was increased in a pattern of moderate LVH. Systolic function was normal. The estimated ejection fraction was in the range of 60% to 65%. Wall motion was normal; there were no regional wall motion abnormalities. Left ventricular diastolic function parameters were normal.    Procedures:   (i.e. Studies not automatically included, echos, thoracentesis, etc; not x-rays)  Consultations:  none  Discharge Exam: Filed Vitals:   03/28/14 0944  BP: 149/89  Pulse:   Temp:   Resp:     General: alert Cardiovascular: s1,s2 rrr Respiratory: CAT BL  Discharge Instructions  Discharge Instructions   Diet - low sodium heart healthy    Complete by:  As directed      Discharge instructions    Complete by:  As directed   Please follow up with primary care doctor in 1 week     Increase activity slowly    Complete by:  As directed             Medication List         amLODipine 5 MG tablet  Commonly known as:  NORVASC  Take 1 tablet (5 mg total) by mouth daily.     B-complex with vitamin C tablet  Take 1 tablet by mouth daily.     cholecalciferol 1000 UNITS tablet  Commonly known as:  VITAMIN D  Take 1,000 Units by mouth daily.     levofloxacin 500 MG tablet  Commonly known as:  LEVAQUIN  Take  1 tablet (500 mg total) by mouth daily.     lisinopril 20 MG tablet  Commonly known as:  PRINIVIL,ZESTRIL  Take 1 tablet (20 mg total) by mouth daily.     MAGNESIUM PO  Take 1 tablet by mouth daily.     nebivolol 5 MG tablet  Commonly known as:  BYSTOLIC  Take 1 tablet (5 mg total) by mouth daily.     potassium gluconate 595 MG Tabs tablet  Take 595 mg by mouth daily.     traMADol 50 MG tablet  Commonly known as:  ULTRAM  Take 1 tablet (50 mg total) by mouth every 6 (six) hours as needed for severe pain.     VITAMIN B-6 PO  Take 1  tablet by mouth daily.     VITAMIN E PO  Take 1 tablet by mouth daily.       No Known Allergies     Follow-up Information   Follow up with [facility]     In 1 week.   Contact information:   [address] [place] [address] [phone]       The results of significant diagnostics from this hospitalization (including imaging, microbiology, ancillary and laboratory) are listed below for reference.    Significant Diagnostic Studies: Dg Chest 2 View  03/27/2014   CLINICAL DATA:  Headache  EXAM: CHEST  2 VIEW  COMPARISON:  None currently available  FINDINGS: Borderline cardiomegaly. Negative upper mediastinal contours. Right heart border is not well visualized, but there is no definite consolidation in the lateral projection. No edema, effusion, or pneumothorax. Negative osseous structures.  IMPRESSION: Right middle lobe opacity which favors atelectasis.   Electronically Signed   By: Tiburcio Pea M.D.   On: 03/27/2014 02:11   Ct Head Wo Contrast  03/27/2014   CLINICAL DATA:  Severe headache since Friday, left arm numbness.  EXAM: CT HEAD WITHOUT CONTRAST  TECHNIQUE: Contiguous axial images were obtained from the base of the skull through the vertex without intravenous contrast.  COMPARISON:  None.  FINDINGS: The ventricles and sulci are normal. No intraparenchymal hemorrhage, mass effect nor midline shift. No acute large vascular territory infarcts.  No abnormal extra-axial fluid collections. Basal cisterns are patent.  No skull fracture. The included ocular globes and orbital contents are non-suspicious. The mastoid aircells and included paranasal sinuses are well-aerated. Soft tissue within the bilateral external auditory canals likely reflects cerumen.  IMPRESSION: No acute intracranial process ; normal noncontrast CT of the head for age.   Electronically Signed   By: Awilda Metro   On: 03/27/2014 02:23   Mri Brain Without  Contrast  03/27/2014   CLINICAL DATA:  Left arm weakness.  Headache.  EXAM: MRI HEAD WITHOUT CONTRAST  MRA HEAD WITHOUT CONTRAST  TECHNIQUE: Multiplanar, multiecho pulse sequences of the brain and surrounding structures were obtained without intravenous contrast. Angiographic images of the head were obtained using MRA technique without contrast.  COMPARISON:  Head CT same day  FINDINGS: MRI HEAD FINDINGS  Diffusion imaging does not show any acute or subacute infarction. The brainstem and cerebellum are normal. The cerebral hemispheres show scattered punctate foci of T2 and FLAIR signal within the white matter consistent with minimal small vessel change. No cortical or large vessel territory infarction. No mass lesion, hemorrhage, hydrocephalus or extra-axial collection. No pituitary mass. No inflammatory sinus disease. No skull or skullbase lesion.  MRA HEAD FINDINGS  Both internal carotid arteries are widely patent  into the brain. The anterior and middle cerebral vessels are patent without proximal stenosis, aneurysm or vascular malformation.  The right vertebral artery is dominant and widely patent to the basilar. The left vertebral artery is dominant, supplies PICA and gives minimal contribution to the basilar. No basilar stenosis. Posterior circulation branch vessels appear normal.  IMPRESSION: No acute or subacute infarction. Mild chronic appearing small vessel change of the cerebral hemispheric white matter.  Normal intracranial MR angiography.   Electronically Signed   By: Paulina FusiMark  Shogry M.D.   On: 03/27/2014 21:00   Mr Maxine GlennMra Head/brain Wo Cm  03/27/2014   CLINICAL DATA:  Left arm weakness.  Headache.  EXAM: MRI HEAD WITHOUT CONTRAST  MRA HEAD WITHOUT CONTRAST  TECHNIQUE: Multiplanar, multiecho pulse sequences of the brain and surrounding structures were obtained without intravenous contrast. Angiographic images of the head were obtained using MRA technique without contrast.  COMPARISON:  Head CT same day   FINDINGS: MRI HEAD FINDINGS  Diffusion imaging does not show any acute or subacute infarction. The brainstem and cerebellum are normal. The cerebral hemispheres show scattered punctate foci of T2 and FLAIR signal within the white matter consistent with minimal small vessel change. No cortical or large vessel territory infarction. No mass lesion, hemorrhage, hydrocephalus or extra-axial collection. No pituitary mass. No inflammatory sinus disease. No skull or skullbase lesion.  MRA HEAD FINDINGS  Both internal carotid arteries are widely patent into the brain. The anterior and middle cerebral vessels are patent without proximal stenosis, aneurysm or vascular malformation.  The right vertebral artery is dominant and widely patent to the basilar. The left vertebral artery is dominant, supplies PICA and gives minimal contribution to the basilar. No basilar stenosis. Posterior circulation branch vessels appear normal.  IMPRESSION: No acute or subacute infarction. Mild chronic appearing small vessel change of the cerebral hemispheric white matter.  Normal intracranial MR angiography.   Electronically Signed   By: Paulina FusiMark  Shogry M.D.   On: 03/27/2014 21:00    Microbiology: No results found for this or any previous visit (from the past 240 hour(s)).   Labs: Basic Metabolic Panel:  Recent Labs Lab 03/27/14 0100 03/27/14 0541  NA 146 143  K 4.2 4.1  CL 107 105  CO2 29 26  GLUCOSE 112* 100*  BUN 19 17  CREATININE 0.80 0.68  CALCIUM 9.6 8.8   Liver Function Tests:  Recent Labs Lab 03/27/14 0100  AST 26  ALT 19  ALKPHOS 68  BILITOT 0.3  PROT 7.4  ALBUMIN 3.7   No results found for this basename: LIPASE, AMYLASE,  in the last 168 hours No results found for this basename: AMMONIA,  in the last 168 hours CBC:  Recent Labs Lab 03/27/14 0100 03/27/14 0541  WBC 7.1 5.8  NEUTROABS 3.9  --   HGB 13.8 13.4  HCT 40.3 40.9  MCV 91.0 93.0  PLT 185 151   Cardiac Enzymes:  Recent Labs Lab  03/27/14 0100  TROPONINI <0.30   BNP: BNP (last 3 results) No results found for this basename: PROBNP,  in the last 8760 hours CBG: No results found for this basename: GLUCAP,  in the last 168 hours     Signed:  Esperanza SheetsBURIEV, ULUGBEK N  [facility] 03/28/2014, 10:12 AM

## 2014-03-28 NOTE — Discharge Instructions (Addendum)
Urinary Tract Infection Urinary tract infections (UTIs) can develop anywhere along your urinary tract. Your urinary tract is your body's drainage system for removing wastes and extra water. Your urinary tract includes two kidneys, two ureters, a bladder, and a urethra. Your kidneys are a pair of bean-shaped organs. Each kidney is about the size of your fist. They are located below your ribs, one on each side of your spine. CAUSES Infections are caused by microbes, which are microscopic organisms, including fungi, viruses, and bacteria. These organisms are so small that they can only be seen through a microscope. Bacteria are the microbes that most commonly cause UTIs. SYMPTOMS  Symptoms of UTIs may vary by age and gender of the patient and by the location of the infection. Symptoms in young women typically include a frequent and intense urge to urinate and a painful, burning feeling in the bladder or urethra during urination. Older women and men are more likely to be tired, shaky, and weak and have muscle aches and abdominal pain. A fever may mean the infection is in your kidneys. Other symptoms of a kidney infection include pain in your back or sides below the ribs, nausea, and vomiting. DIAGNOSIS To diagnose a UTI, your caregiver will ask you about your symptoms. Your caregiver also will ask to provide a urine sample. The urine sample will be tested for bacteria and white blood cells. White blood cells are made by your body to help fight infection. TREATMENT  Typically, UTIs can be treated with medication. Because most UTIs are caused by a bacterial infection, they usually can be treated with the use of antibiotics. The choice of antibiotic and length of treatment depend on your symptoms and the type of bacteria causing your infection. HOME CARE INSTRUCTIONS  If you were prescribed antibiotics, take them exactly as your caregiver instructs you. Finish the medication even if you feel better after you  have only taken some of the medication.  Drink enough water and fluids to keep your urine clear or pale yellow.  Avoid caffeine, tea, and carbonated beverages. They tend to irritate your bladder.  Empty your bladder often. Avoid holding urine for long periods of time.  Empty your bladder before and after sexual intercourse.  After a bowel movement, women should cleanse from front to back. Use each tissue only once. SEEK MEDICAL CARE IF:   You have back pain.  You develop a fever.  Your symptoms do not begin to resolve within 3 days. SEEK IMMEDIATE MEDICAL CARE IF:   You have severe back pain or lower abdominal pain.  You develop chills.  You have nausea or vomiting.  You have continued burning or discomfort with urination. MAKE SURE YOU:   Understand these instructions.  Will watch your condition.  Will get help right away if you are not doing well or get worse. Document Released: 06/25/2005 Document Revised: 03/16/2012 Document Reviewed: 10/24/2011 [facility] Patient Information 2015 [employer] [place]. This information is not intended to replace advice given to you by your health care provider. Make sure you discuss any questions you have with your health care provider. STROKE/TIA DISCHARGE INSTRUCTIONS SMOKING Cigarette smoking nearly doubles your risk of having a stroke & is the single most alterable risk factor  If you smoke or have smoked in the last 12 months, you are advised to quit smoking for your health.  Most of the excess cardiovascular risk related to smoking disappears within a year of stopping.  Ask you doctor about anti-smoking medications  [place] Quit Line: 1-800-QUIT NOW  Free Smoking Cessation Classes [phone]  CHOLESTEROL Know your levels; limit fat & cholesterol in your diet  Lipid Panel     Component Value Date/Time   CHOL 152 03/28/2014 0437   TRIG 67 03/28/2014 0437   HDL 53 03/28/2014 0437   CHOLHDL 2.9 03/28/2014 0437   VLDL 13 03/28/2014 0437     LDLCALC 86 03/28/2014 0437      Many patients benefit from treatment even if their cholesterol is at goal.  Goal: Total Cholesterol (CHOL) less than 160  Goal:  Triglycerides (TRIG) less than 150  Goal:  HDL greater than 40  Goal:  LDL (LDLCALC) less than 100   BLOOD PRESSURE American Stroke Association blood pressure target is less that 120/80 mm/Hg  Your discharge blood pressure is:  BP: 185/99 mmHg  Monitor your blood pressure  Limit your salt and alcohol intake  Many individuals will require more than one medication for high blood pressure  DIABETES (A1c is a blood sugar average for last 3 months) Goal HGBA1c is under 7% (HBGA1c is blood sugar average for last 3 months)  Diabetes: No known diagnosis of diabetes    Lab Results  Component Value Date   HGBA1C 6.0* 03/27/2014     Your HGBA1c can be lowered with medications, healthy diet, and exercise.  Check your blood sugar as directed by your physician  Call your physician if you experience unexplained or low blood sugars.  PHYSICAL ACTIVITY/REHABILITATION Goal is 30 minutes at least 4 days per week  Activity: Increase activity slowly, Therapies: Physical Therapy: NONE Return to work: Per MD order  Activity decreases your risk of heart attack and stroke and makes your heart stronger.  It helps control your weight and blood pressure; helps you relax and can improve your mood.  Participate in a regular exercise program.  Talk with your doctor about the best form of exercise for you (dancing, walking, swimming, cycling).  DIET/WEIGHT Goal is to maintain a healthy weight  Your discharge diet is: Cardiac thin liquids Your height is:  Height: 5' 9.5" (176.5 cm) Your current weight is: Weight: 118.978 kg (262 lb 4.8 oz) Your Body Mass Index (BMI) is:  BMI (Calculated): 32.8  Following the type of diet specifically designed for you will help prevent another stroke.  Your goal weight range is:  131-160  Your goal Body  Mass Index (BMI) is 19-24.  Healthy food habits can help reduce 3 risk factors for stroke:  High cholesterol, hypertension, and excess weight.  RESOURCES Stroke/Support Group:  Call [phone]   STROKE EDUCATION PROVIDED/REVIEWED AND GIVEN TO PATIENT Stroke warning signs and symptoms How to activate emergency medical system (call 911). Medications prescribed at discharge. Need for follow-up after discharge. Personal risk factors for stroke. Pneumonia vaccine given: No Flu vaccine given: No My questions have been answered, the writing is legible, and I understand these instructions.  I will adhere to these goals & educational materials that have been provided to me after my discharge from the hospital.

## 2014-03-28 NOTE — Progress Notes (Signed)
UR Completed.  [name], Olivia Jane [phone] 03/28/2014  

## 2015-03-06 ENCOUNTER — Other Ambulatory Visit: Payer: Self-pay | Admitting: Family Medicine

## 2015-03-06 ENCOUNTER — Other Ambulatory Visit (HOSPITAL_COMMUNITY): Payer: Self-pay | Admitting: Family Medicine

## 2015-12-18 ENCOUNTER — Encounter (HOSPITAL_BASED_OUTPATIENT_CLINIC_OR_DEPARTMENT_OTHER): Payer: Self-pay | Admitting: Emergency Medicine

## 2015-12-18 ENCOUNTER — Emergency Department (HOSPITAL_BASED_OUTPATIENT_CLINIC_OR_DEPARTMENT_OTHER): Payer: Self-pay

## 2015-12-18 ENCOUNTER — Emergency Department (HOSPITAL_BASED_OUTPATIENT_CLINIC_OR_DEPARTMENT_OTHER)
Admission: EM | Admit: 2015-12-18 | Discharge: 2015-12-18 | Disposition: A | Discharge status: Home / Self Care | Payer: Self-pay | Attending: Emergency Medicine | Admitting: Emergency Medicine

## 2015-12-18 DIAGNOSIS — Z7982 Long term (current) use of aspirin: Secondary | ICD-10-CM | POA: Insufficient documentation

## 2015-12-18 DIAGNOSIS — Z79899 Other long term (current) drug therapy: Secondary | ICD-10-CM | POA: Insufficient documentation

## 2015-12-18 DIAGNOSIS — Z8673 Personal history of transient ischemic attack (TIA), and cerebral infarction without residual deficits: Secondary | ICD-10-CM | POA: Insufficient documentation

## 2015-12-18 DIAGNOSIS — I10 Essential (primary) hypertension: Secondary | ICD-10-CM | POA: Insufficient documentation

## 2015-12-18 DIAGNOSIS — R202 Paresthesia of skin: Secondary | ICD-10-CM

## 2015-12-18 DIAGNOSIS — R200 Anesthesia of skin: Secondary | ICD-10-CM | POA: Insufficient documentation

## 2015-12-18 DIAGNOSIS — R1032 Left lower quadrant pain: Secondary | ICD-10-CM | POA: Insufficient documentation

## 2015-12-18 HISTORY — DX: Cerebral infarction, unspecified: I63.9

## 2015-12-18 LAB — CBC WITH DIFFERENTIAL/PLATELET
BASOS ABS: 0 10*3/uL (ref 0.0–0.1)
Basophils Relative: 0 %
Eosinophils Absolute: 0.1 10*3/uL (ref 0.0–0.7)
Eosinophils Relative: 1 %
HEMATOCRIT: 40.3 % (ref 36.0–46.0)
Hemoglobin: 13.9 g/dL (ref 12.0–15.0)
LYMPHS ABS: 1.5 10*3/uL (ref 0.7–4.0)
Lymphocytes Relative: 27 %
MCH: 30.9 pg (ref 26.0–34.0)
MCHC: 34.5 g/dL (ref 30.0–36.0)
MCV: 89.6 fL (ref 78.0–100.0)
Monocytes Absolute: 0.6 10*3/uL (ref 0.1–1.0)
Monocytes Relative: 10 %
NEUTROS ABS: 3.4 10*3/uL (ref 1.7–7.7)
Neutrophils Relative %: 62 %
Platelets: 194 10*3/uL (ref 150–400)
RBC: 4.5 MIL/uL (ref 3.87–5.11)
RDW: 13.5 % (ref 11.5–15.5)
WBC: 5.6 10*3/uL (ref 4.0–10.5)

## 2015-12-18 LAB — COMPREHENSIVE METABOLIC PANEL
ALBUMIN: 4.1 g/dL (ref 3.5–5.0)
ALT: 17 U/L (ref 14–54)
ANION GAP: 10 (ref 5–15)
AST: 25 U/L (ref 15–41)
Alkaline Phosphatase: 51 U/L (ref 38–126)
BILIRUBIN TOTAL: 0.9 mg/dL (ref 0.3–1.2)
BUN: 18 mg/dL (ref 6–20)
CHLORIDE: 103 mmol/L (ref 101–111)
CO2: 27 mmol/L (ref 22–32)
Calcium: 9.4 mg/dL (ref 8.9–10.3)
Creatinine, Ser: 0.68 mg/dL (ref 0.44–1.00)
GFR calc Af Amer: >60 mL/min (ref >60)
GFR calc non Af Amer: >60 mL/min (ref >60)
Glucose, Bld: 92 mg/dL (ref 65–99)
POTASSIUM: 3.2 mmol/L — AB (ref 3.5–5.1)
SODIUM: 140 mmol/L (ref 135–145)
TOTAL PROTEIN: 7.6 g/dL (ref 6.5–8.1)

## 2015-12-18 MED ORDER — KETOROLAC TROMETHAMINE 30 MG/ML IJ SOLN
30.0000 mg | Freq: Once | INTRAMUSCULAR | Status: AC
  Administered 2015-12-18: 30 mg via INTRAVENOUS
  Filled 2015-12-18: qty 1

## 2015-12-18 MED ORDER — MORPHINE SULFATE (PF) 4 MG/ML IV SOLN
4.0000 mg | Freq: Once | INTRAVENOUS | Status: AC
  Administered 2015-12-18: 4 mg via INTRAVENOUS
  Filled 2015-12-18: qty 1

## 2015-12-18 NOTE — Discharge Instructions (Signed)
Community Resource Guide Financial Assistance °The United Way’s “211” is a great source of information about community services available.  Access by dialing 2-1-1 from anywhere in [place], or by website -  www.nc211.org.  ° °Other Local Resources (Updated 09/2015) ° °Financial Assistance °  °Services ° °  °Phone Number and Address  °[facility] • Low-cost medical care - 1st and 3rd Saturday of every month °• Must not qualify for public or private insurance and must have limited income [phone] °[address] °[place], [place]  °  °[facility] • Child care °• Emergency assistance for housing and utilities °• Food stamps °• Medicaid 336-570-6532 °[address] °[place], [place] [address] °  °[facility] • Low-cost medical care for children, communicable diseases, sexually-transmitted diseases, immunizations, maternity care, women’s health and family planning [phone] °[address] °[place], [place] [address]  °[facility]  • Medication assistance for [place] residents °• Must meet income requirements [phone] °[address] [place], [place].    °[facility] • Child care °• Emergency assistance for housing and utilities °• Food stamps °• Medicaid [phone] °[address] °[place], [place] [address]  °[facility]  • Low-cost medical care,  °• Monday through Friday, 9 am to 6 pm.   Accepts Medicare/Medicaid, and self-pay [phone] °[address] °[place], [place] [address]  °[facility] • Low-cost medical care - Monday through Friday, 8:30 am - 5:30 pm °• Accepts Medicaid and self-pay [phone] °[address] [place], [place] [address]   °[facility] • Primary medical care, including for those with sickle cell disease °• Accepts Medicare,  Medicaid, insurance and self-pay [phone] °[address] °[place], [place]  °[facility]  • Primary medical care °• Accepts Medicare, Medicaid, insurance and self-pay [phone] °[address] [place], [place] [address] °  °[place] • Child care °• Emergency assistance for housing and utilities °• Food stamps °• Medicaid 336-703-3800 °[address] °[place], [place] [address]  °[place] • Child care °• Emergency assistance for housing and utilities °• Food stamps °• Medicaid 336-641-3000 °[address] °[place], [place] [address] °  °[place] [facility] • Medication assistance for [place] residents with no insurance only °• Must have a primary care doctor [phone] °[address] °[place], [place]  °[facility]  • Primary medical care °• Accepts Medicare, Medicaid, insurance  [phone] °[address] °[place], [place]  °[facility] ° • Medication assistance [phone]  °[facility]  • Primary medical care °• Accepts Medicare, Medicaid, insurance and self-pay [phone] °[address] [place], [place] [address]  °[facility]  • Primary medical care °• Accepts Medicare, Medicaid, insurance and self-pay [phone] °[address] [place], [place] [address]  °[facility] • For [place] residents between the ages of 18 and 64 who do not have any form of health insurance, Medicare, Medicaid, or VA benefits.  Services are provided free of charge to uninsured patients who fall within federal poverty guidelines.   °• Hours: Tuesdays and Thursdays, 4:15 - 8 pm [phone] °[address] °[place],  [address]  °[facility] ° ° ° • Primary medical care °• Dental   care °• Nutritional counseling °• Pharmacy °• Accepts Medicaid, Medicare,  most insurance. °• Fees are adjusted based on ability to pay.   [phone] °[facility] °[address] [place], [place] ° °[phone] °[facility] °[address] °[place], [place] ° °[phone] °[facility] °[place], [place] ° °[phone] °[facility], [address] °[place], [place] ° °[phone] °[facility] °[address] °[place], [place]  °[facility] • Women’s health and family planning [phone] °[address] °[place], [place]  °[place] [facility] • Child care °• Emergency assistance for housing and utilities °• Food stamps °• Medicaid 336-683-8000 °[address], °[place], [place] [address] °  °[facility]  ° • Ages 18 and older °• Hours: Mondays and Thursdays, 7:00 am - 9:00 am Patients are seen on a first come, first served basis. [phone], ext. 123 °[address] °[place], [place]  °[place] [facility] • Child care °• Emergency assistance for housing and utilities °• Food stamps °• Medicaid 336-342-1394 °[address] °[place], [place] [address]  °[facility] • Medication assistance °• Rental assistance °• Food pantry °• Medication assistance °• Housing assistance °• Emergency food distribution °• Utility assistance 336-222-5529 °[address] °[place], [place] ° °[phone]  °[address] °[place], [place] [address] °Hours: Tuesdays and Thursdays from 9am - 12 noon by appointment only ° °[phone] °[address] °[place], [place] [address]  °[facility] ° • Accepts private insurance, Medicare, and Medicaid. °• Payment is based on a sliding scale for those without insurance. °• Hours: Mondays, Tuesdays and Thursdays, 8:30 am - 5:30 pm.   [phone] °[address] °[place], [place]  °[facility] - [facility] at  [address]  ° • Accepts private insurance, Medicare, and Medicaid. °• Payment is based on a sliding scale for those without insurance. [phone] °[address] °[place], [place]  °[facility] - [facility] [address] • Accepts private insurance, Medicare, and Medicaid. °• Payment is based on a sliding scale for those without insurance [phone] °[address], [place], [place]  °[facility] [address] • Accepts private insurance, Medicare, and Medicaid. °• Payment is based on a sliding scale for those without insurance. [phone] °[address] °[place], [place]  °[facility] [address] • Accepts private insurance, Medicare, and Medicaid. °• Payment is based on a sliding scale for those without insurance. [phone], ext. [phone] °[address] °[place], [place].    °[facility] • Maternity care. °• Accepts Medicaid and self-pay. [phone] °[address] °, [place]  ° ° °

## 2015-12-18 NOTE — ED Provider Notes (Signed)
CSN: [id number]     Arrival date & time 12/18/15  0930 History   First MD Initiated Contact with Patient 12/18/15 0957     Chief Complaint  Patient presents with  . Numbness      The history is provided by the patient and medical records.  Patient presents to the emergency department with complaints of paresthesias of the left upper and left lower extremity over the past 5 days.  Also reports developing left groin and left hip pain over that same period time.  She reports some difficulty walking secondary to pain.  She denies weakness of her arms or legs.  She does report that in June 2016 she had a stroke with similar symptoms.  At that time she was having headache.  She has no headache at this time.  She denies chest pain shortness of breath.  Denies abdominal pain.  No fevers or chills.  No dysuria or urinary frequency.  Denies nausea vomiting.  Past Medical History  Diagnosis Date  . Hypertension   . Stroke [facility](HCC)    Past Surgical History  Procedure Laterality Date  . Abdominal hysterectomy    . Hernia repair    . Tubal ligation     Family History  Problem Relation Age of Onset  . Hypertension Mother   . Diabetes Sister   . Hypertension Brother   . Hypertension Maternal Aunt   . Heart attack Neg Hx   . Hyperlipidemia Neg Hx   . Sudden death Neg Hx    Social History  Substance Use Topics  . Smoking status: Never Smoker   . Smokeless tobacco: None  . Alcohol Use: No   OB History    No data available     Review of Systems  All other systems reviewed and are negative.     Allergies  Review of patient's allergies indicates no known allergies.  Home Medications   Prior to Admission medications   Medication Sig Start Date End Date Taking? Authorizing Provider  amLODipine (NORVASC) 5 MG tablet Take 1 tablet (5 mg total) by mouth daily. 03/28/14  Yes Esperanza SheetsUlugbek N Buriev, MD  aspirin EC 81 MG tablet Take 1 tablet (81 mg total) by mouth daily. 03/28/14  Yes Esperanza SheetsUlugbek N  Buriev, MD  chlorthalidone (HYGROTON) 25 MG tablet Take 25 mg by mouth daily.   Yes Historical Provider, MD  B Complex-C (B-COMPLEX WITH VITAMIN C) tablet Take 1 tablet by mouth daily.    Historical Provider, MD  cholecalciferol (VITAMIN D) 1000 UNITS tablet Take 1,000 Units by mouth daily.    Historical Provider, MD  MAGNESIUM PO Take 1 tablet by mouth daily.    Historical Provider, MD  potassium gluconate 595 MG TABS tablet Take 595 mg by mouth daily.    Historical Provider, MD  Pyridoxine HCl (VITAMIN B-6 PO) Take 1 tablet by mouth daily.     Historical Provider, MD  VITAMIN E PO Take 1 tablet by mouth daily.    Historical Provider, MD   BP 143/83 mmHg  Pulse 66  Temp(Src) 97.6 F (36.4 C) (Oral)  Resp 14  Wt 223 lb (101.152 kg)  SpO2 97% Physical Exam  Constitutional: She is oriented to person, place, and time. She appears well-developed and well-nourished. No distress.  HENT:  Head: Normocephalic and atraumatic.  Eyes: EOM are normal. Pupils are equal, round, and reactive to light.  Neck: Normal range of motion.  Cardiovascular: Normal rate, regular rhythm and normal heart sounds.  Pulmonary/Chest: Effort normal and breath sounds normal.  Abdominal: Soft. She exhibits no distension. There is no tenderness.  Musculoskeletal:  Mild pain with range of motion of left hip.  Left lower extremity is not swollen as compared to the right.  Skin on the left hip and left buttock appears normal without rash.  Neurological: She is alert and oriented to person, place, and time.  5/5 strength in major muscle groups of  bilateral upper and lower extremities. Speech normal. No facial asymetry.   Skin: Skin is warm and dry.  Psychiatric: She has a normal mood and affect. Judgment normal.  Nursing note and vitals reviewed.   ED Course  Procedures (including critical care time) Labs Review Labs Reviewed  CBC WITH DIFFERENTIAL/PLATELET  COMPREHENSIVE METABOLIC PANEL    Imaging Review Ct  Head Wo Contrast  12/18/2015  CLINICAL DATA:  Acute left-sided paresthesias.  Left hip pain. EXAM: CT HEAD WITHOUT CONTRAST TECHNIQUE: Contiguous axial images were obtained from the base of the skull through the vertex without contrast. COMPARISON:  03/13/2015 FINDINGS: Normal appearance of the intracranial structures. No evidence for acute hemorrhage, mass lesion, midline shift, hydrocephalus or large infarct. No acute bony abnormality. The visualized sinuses are clear. IMPRESSION: No acute intracranial abnormality. Electronically Signed   By: Judie Petit.  Shick M.D.   On: 12/18/2015 10:35   Dg Hip Unilat With Pelvis 2-3 Views Left  12/18/2015  CLINICAL DATA:  Left hip pain for 4 days.  No history of trauma EXAM: DG HIP (WITH OR WITHOUT PELVIS) 2-3V LEFT COMPARISON:  July 20, 2011 FINDINGS: Frontal pelvis as well as frontal and lateral left hip images were obtained. There is no fracture or dislocation. Joint spaces appear normal. No erosive change. IMPRESSION: No fracture or dislocation.  No appreciable arthropathy. Electronically Signed   By: Bretta Bang III M.D.   On: 12/18/2015 10:48   I have personally reviewed and evaluated these images and lab results as part of my medical decision-making.   EKG Interpretation None      MDM   Final diagnoses:  None    Patient is overall well-appearing.  Much of her pain seems more musculoskeletal in nature.  X-ray of left hip is without pathology.  Doubt DVT.  She does have normal strength of her arms and legs I think that she's been walking with a bit of a limp secondary to the pain in the left buttock and hip region.  She is mildly tender in the left sciatic groove.  She has no L-spine tenderness.  Pain treated in the emergency department.  My suspicion for stroke is very low.  I will make sure she is taking an 81 mg aspirin and plan on having her follow-up with a primary care physician.  She understands to return to the emergency department for new or  worsening symptoms.  Patient given information on low cost medical resources in the community.    Azalia Bilis, MD 12/18/15 [phone]

## 2016-12-21 ENCOUNTER — Emergency Department (HOSPITAL_BASED_OUTPATIENT_CLINIC_OR_DEPARTMENT_OTHER): Payer: No Typology Code available for payment source

## 2016-12-21 ENCOUNTER — Encounter (HOSPITAL_BASED_OUTPATIENT_CLINIC_OR_DEPARTMENT_OTHER): Payer: Self-pay | Admitting: Emergency Medicine

## 2016-12-21 ENCOUNTER — Emergency Department (HOSPITAL_BASED_OUTPATIENT_CLINIC_OR_DEPARTMENT_OTHER)
Admission: EM | Admit: 2016-12-21 | Discharge: 2016-12-21 | Disposition: A | Discharge status: Home / Self Care | Payer: No Typology Code available for payment source | Attending: Emergency Medicine | Admitting: Emergency Medicine

## 2016-12-21 DIAGNOSIS — N10 Acute tubulo-interstitial nephritis: Secondary | ICD-10-CM | POA: Insufficient documentation

## 2016-12-21 DIAGNOSIS — Z79899 Other long term (current) drug therapy: Secondary | ICD-10-CM | POA: Diagnosis not present

## 2016-12-21 DIAGNOSIS — Z7982 Long term (current) use of aspirin: Secondary | ICD-10-CM | POA: Diagnosis not present

## 2016-12-21 DIAGNOSIS — I10 Essential (primary) hypertension: Secondary | ICD-10-CM | POA: Diagnosis not present

## 2016-12-21 DIAGNOSIS — R51 Headache: Secondary | ICD-10-CM | POA: Diagnosis not present

## 2016-12-21 DIAGNOSIS — R519 Headache, unspecified: Secondary | ICD-10-CM

## 2016-12-21 DIAGNOSIS — R1032 Left lower quadrant pain: Secondary | ICD-10-CM | POA: Diagnosis present

## 2016-12-21 LAB — CBC
HCT: 37.4 % (ref 36.0–46.0)
HEMOGLOBIN: 12.6 g/dL (ref 12.0–15.0)
MCH: 30.9 pg (ref 26.0–34.0)
MCHC: 33.7 g/dL (ref 30.0–36.0)
MCV: 91.7 fL (ref 78.0–100.0)
Platelets: 160 10*3/uL (ref 150–400)
RBC: 4.08 MIL/uL (ref 3.87–5.11)
RDW: 13.7 % (ref 11.5–15.5)
WBC: 12.6 10*3/uL — ABNORMAL HIGH (ref 4.0–10.5)

## 2016-12-21 LAB — URINALYSIS, ROUTINE W REFLEX MICROSCOPIC
Bilirubin Urine: NEGATIVE
GLUCOSE, UA: NEGATIVE mg/dL
KETONES UR: NEGATIVE mg/dL
NITRITE: NEGATIVE
PROTEIN: 100 mg/dL — AB
Specific Gravity, Urine: 1.015 (ref 1.005–1.030)
pH: 8 (ref 5.0–8.0)

## 2016-12-21 LAB — URINALYSIS, MICROSCOPIC (REFLEX)

## 2016-12-21 LAB — TROPONIN I: Troponin I: <0.03 ng/mL (ref <0.03)

## 2016-12-21 LAB — BASIC METABOLIC PANEL
Anion gap: 8 (ref 5–15)
BUN: 23 mg/dL — ABNORMAL HIGH (ref 6–20)
CO2: 29 mmol/L (ref 22–32)
Calcium: 9 mg/dL (ref 8.9–10.3)
Chloride: 102 mmol/L (ref 101–111)
Creatinine, Ser: 1 mg/dL (ref 0.44–1.00)
GFR calc Af Amer: >60 mL/min (ref >60)
GFR, EST NON AFRICAN AMERICAN: 58 mL/min — AB (ref >60)
GLUCOSE: 113 mg/dL — AB (ref 65–99)
POTASSIUM: 3.2 mmol/L — AB (ref 3.5–5.1)
Sodium: 139 mmol/L (ref 135–145)

## 2016-12-21 MED ORDER — DEXTROSE 5 % IV SOLN
1.0000 g | Freq: Once | INTRAVENOUS | Status: AC
  Administered 2016-12-21: 1 g via INTRAVENOUS
  Filled 2016-12-21: qty 10

## 2016-12-21 MED ORDER — IOPAMIDOL (ISOVUE-300) INJECTION 61%
100.0000 mL | Freq: Once | INTRAVENOUS | Status: AC | PRN
  Administered 2016-12-21: 100 mL via INTRAVENOUS

## 2016-12-21 MED ORDER — ONDANSETRON HCL 4 MG/2ML IJ SOLN
4.0000 mg | Freq: Once | INTRAMUSCULAR | Status: AC
  Administered 2016-12-21: 4 mg via INTRAVENOUS
  Filled 2016-12-21: qty 2

## 2016-12-21 MED ORDER — MORPHINE SULFATE (PF) 4 MG/ML IV SOLN
4.0000 mg | Freq: Once | INTRAVENOUS | Status: AC
  Administered 2016-12-21: 4 mg via INTRAVENOUS
  Filled 2016-12-21: qty 1

## 2016-12-21 NOTE — Discharge Instructions (Signed)
Keflex as prescribed.  Ibuprofen 600 g every 6 hours as needed for pain or fever.  Return to the emergency department if your symptoms significantly worsen or change.

## 2016-12-21 NOTE — ED Notes (Signed)
Pt reports chest pain and LLQ abdominal pain that both started today. No other complaints at this time.

## 2016-12-21 NOTE — ED Triage Notes (Addendum)
Patient reports chest pain which began yesterday.  Denies shortness of breath.  Denies nausea or vomiting.  Patient seems short of breath on triage and appears diaphoretic.  Patient alert and oriented.  Patient also reports pain in her bilateral upper legs and lower abdomen.

## 2016-12-21 NOTE — ED Notes (Signed)
Assisted pt to bathroom. Pt a little unsteady on feet. Pt stated her head and back were hurting. RN notified.

## 2016-12-21 NOTE — ED Provider Notes (Signed)
[facility] Provider Note   CSN: [id number] Arrival date & time: 12/21/16  1930   By signing my name below, I, Teofilo Pod, attest that this documentation has been prepared under the direction and in the presence of Geoffery Lyons, MD . Electronically Signed: Teofilo Pod, ED Scribe. 12/21/2016. 7:45 PM.   History   Chief Complaint Chief Complaint  Patient presents with  . Chest Pain    The history is provided by the patient. No language interpreter was used.  Chest Pain   This is a new problem. The current episode started yesterday. The problem occurs constantly. The problem has not changed since onset.Associated symptoms include abdominal pain and diaphoresis.  Her past medical history is significant for hypertension and strokes.   HPI Comments:  Olivia [name] is a 66 y.o. female with PMHx of HTN and strokes who presents to the Emergency Department complaining of continuing chest pain that began late last night. Pt complains of associated lower abdominal pain, leg pain, diaphoresis. She states that the pain is most severe in her abdomen. Pt reports having a stress test at [facility], and reports having a stroke over 1 year ago which left her with left sided weakness. Pt takes medication for her BP regularly. No alleviating factors noted. Pt denies other associated symptoms.   Past Medical History:  Diagnosis Date  . Hypertension   . Stroke [facility]     Patient Active Problem List   Diagnosis Date Noted  . Hypertensive urgency 03/27/2014  . Left arm weakness 03/27/2014  . Headache(784.0) 03/27/2014  . UTI (urinary tract infection) 03/27/2014  . Low back pain 03/27/2014  . Muscle strain, multiple sites 07/24/2011  . Right ankle sprain 07/24/2011  . Wrist sprain 07/24/2011    Past Surgical History:  Procedure Laterality Date  . ABDOMINAL HYSTERECTOMY    . HERNIA REPAIR    . TUBAL LIGATION      OB History    No data available       Home  Medications    Prior to Admission medications   Medication Sig Start Date End Date Taking? Authorizing Provider  amLODipine (NORVASC) 5 MG tablet Take 1 tablet (5 mg total) by mouth daily. 03/28/14   Esperanza Sheets, MD  aspirin EC 81 MG tablet Take 1 tablet (81 mg total) by mouth daily. 03/28/14   Esperanza Sheets, MD  B Complex-C (B-COMPLEX WITH VITAMIN C) tablet Take 1 tablet by mouth daily.    Historical Provider, MD  chlorthalidone (HYGROTON) 25 MG tablet Take 25 mg by mouth daily.    Historical Provider, MD  cholecalciferol (VITAMIN D) 1000 UNITS tablet Take 1,000 Units by mouth daily.    Historical Provider, MD  MAGNESIUM PO Take 1 tablet by mouth daily.    Historical Provider, MD  potassium gluconate 595 MG TABS tablet Take 595 mg by mouth daily.    Historical Provider, MD  Pyridoxine HCl (VITAMIN B-6 PO) Take 1 tablet by mouth daily.     Historical Provider, MD  VITAMIN E PO Take 1 tablet by mouth daily.    Historical Provider, MD    Family History Family History  Problem Relation Age of Onset  . Hypertension Mother   . Diabetes Sister   . Hypertension Brother   . Hypertension Maternal Aunt   . Heart attack Neg Hx   . Hyperlipidemia Neg Hx   . Sudden death Neg Hx     Social History Social History  Substance  Use Topics  . Smoking status: Never Smoker  . Smokeless tobacco: Never Used  . Alcohol use No     Allergies   Patient has no known allergies.   Review of Systems Review of Systems  Constitutional: Positive for diaphoresis.  Cardiovascular: Positive for chest pain.  Gastrointestinal: Positive for abdominal pain.  Musculoskeletal: Positive for myalgias.     Physical Exam Updated Vital Signs BP (!) 198/88 (BP Location: Right Arm)   Pulse 100   Temp 98.2 F (36.8 C) (Oral)   Resp (!) 26   Ht 5\' 9"  (1.753 m)   Wt 210 lb (95.3 kg)   SpO2 96%   BMI 31.01 kg/m   Physical Exam  Constitutional: She is oriented to person, place, and time. She appears  well-developed and well-nourished. No distress.  HENT:  Head: Normocephalic and atraumatic.  Mouth/Throat: Oropharynx is clear and moist. No oropharyngeal exudate.  Eyes: Conjunctivae and EOM are normal. Pupils are equal, round, and reactive to light.  Neck: Normal range of motion. Neck supple.  No meningismus.  Cardiovascular: Normal rate, regular rhythm, normal heart sounds and intact distal pulses.   No murmur heard. Pulmonary/Chest: Effort normal and breath sounds normal. No respiratory distress.  Abdominal: Soft. There is no tenderness. There is no rebound and no guarding.  TTP to LLQ  Musculoskeletal: Normal range of motion. She exhibits no edema or tenderness.  Neurological: She is alert and oriented to person, place, and time. No cranial nerve deficit. She exhibits normal muscle tone. Coordination normal.  No ataxia on finger to nose bilaterally. No pronator drift. 5/5 strength throughout. CN 2-12 intact.Equal grip strength. Sensation intact.   Skin: Skin is warm.  Psychiatric: She has a normal mood and affect. Her behavior is normal.  Nursing note and vitals reviewed.    ED Treatments / Results  DIAGNOSTIC STUDIES:  Oxygen Saturation is 96% on RA, normal by my interpretation.    COORDINATION OF CARE:  7:44 PM Discussed treatment plan with pt at bedside and pt agreed to plan.   Labs (all labs ordered are listed, but only abnormal results are displayed) Labs Reviewed  BASIC METABOLIC PANEL  CBC  TROPONIN I    EKG  EKG Interpretation None       Radiology No results found.  Procedures Procedures (including critical care time)  Medications Ordered in ED Medications - No data to display   Initial Impression / Assessment and Plan / ED Course  I have reviewed the triage vital signs and the nursing notes.  Pertinent labs & imaging results that were available during my care of the patient were reviewed by me and considered in my medical decision making (see  chart for details).  Patient presents with complaints of abdominal discomfort. Laboratory studies and CT were obtained. 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 She will be given Rocephin.  Patient also complain of headache while in the emergency department. After receiving morphine, she became somewhat somnolent and unsteady on her feet. For this reason, a head CT was obtained which was unremarkable. I suspect her unsteadiness is related to the medications and lack of sleep. Husband stated that she did not sleep at all the night before secondary to the symptoms she has been experiencing. She was offered admission, however is adamant that she go home. She will be prescribed Keflex and advised to  return as needed for any problems.  Final Clinical Impressions(s) / ED Diagnoses   Final diagnoses:  None    New Prescriptions New Prescriptions   No medications on file  I personally performed the services described in this documentation, which was scribed in my presence. The recorded information has been reviewed and is accurate.        Geoffery Lyons, MD 12/21/16 [phone]

## 2016-12-21 NOTE — ED Notes (Signed)
Pt discharged to home with family. NAD.  

## 2016-12-22 ENCOUNTER — Telehealth (HOSPITAL_BASED_OUTPATIENT_CLINIC_OR_DEPARTMENT_OTHER): Payer: Self-pay | Admitting: *Deleted

## 2016-12-22 MED FILL — CEPHALEXIN 500 MG CAPSULE: 500 | 7 days supply | Qty: 28 | Fill #0

## 2016-12-22 NOTE — Telephone Encounter (Signed)
Spoke with Ms. Olivia [name] who states she did not receive her Rx at discharge from ED visit yesterday. Chart reviewed by Rhea BleacherJosh Geiple, EDPA. VORB for Keflex 500mg  QID x 7 days, dispense 28, no refills, [facility]. Pt states she will pick up today

## 2016-12-31 ENCOUNTER — Emergency Department (HOSPITAL_BASED_OUTPATIENT_CLINIC_OR_DEPARTMENT_OTHER): Payer: No Typology Code available for payment source

## 2016-12-31 ENCOUNTER — Emergency Department (HOSPITAL_BASED_OUTPATIENT_CLINIC_OR_DEPARTMENT_OTHER)
Admission: EM | Admit: 2016-12-31 | Discharge: 2016-12-31 | Disposition: A | Discharge status: Home / Self Care | Payer: No Typology Code available for payment source | Attending: Emergency Medicine | Admitting: Emergency Medicine

## 2016-12-31 ENCOUNTER — Encounter (HOSPITAL_BASED_OUTPATIENT_CLINIC_OR_DEPARTMENT_OTHER): Payer: Self-pay

## 2016-12-31 DIAGNOSIS — R200 Anesthesia of skin: Secondary | ICD-10-CM | POA: Diagnosis not present

## 2016-12-31 DIAGNOSIS — I10 Essential (primary) hypertension: Secondary | ICD-10-CM | POA: Insufficient documentation

## 2016-12-31 DIAGNOSIS — R5383 Other fatigue: Secondary | ICD-10-CM | POA: Diagnosis present

## 2016-12-31 DIAGNOSIS — Z7982 Long term (current) use of aspirin: Secondary | ICD-10-CM | POA: Insufficient documentation

## 2016-12-31 DIAGNOSIS — N390 Urinary tract infection, site not specified: Secondary | ICD-10-CM | POA: Diagnosis not present

## 2016-12-31 DIAGNOSIS — R0602 Shortness of breath: Secondary | ICD-10-CM | POA: Diagnosis not present

## 2016-12-31 LAB — CBC WITH DIFFERENTIAL/PLATELET
Basophils Absolute: 0 10*3/uL (ref 0.0–0.1)
Basophils Relative: 0 %
Eosinophils Absolute: 0.1 10*3/uL (ref 0.0–0.7)
Eosinophils Relative: 1 %
HEMATOCRIT: 37.8 % (ref 36.0–46.0)
HEMOGLOBIN: 13.1 g/dL (ref 12.0–15.0)
LYMPHS ABS: 1.6 10*3/uL (ref 0.7–4.0)
LYMPHS PCT: 21 %
MCH: 31.1 pg (ref 26.0–34.0)
MCHC: 34.7 g/dL (ref 30.0–36.0)
MCV: 89.8 fL (ref 78.0–100.0)
MONOS PCT: 8 %
Monocytes Absolute: 0.6 10*3/uL (ref 0.1–1.0)
NEUTROS ABS: 5.2 10*3/uL (ref 1.7–7.7)
NEUTROS PCT: 70 %
Platelets: 304 10*3/uL (ref 150–400)
RBC: 4.21 MIL/uL (ref 3.87–5.11)
RDW: 14.2 % (ref 11.5–15.5)
WBC: 7.6 10*3/uL (ref 4.0–10.5)

## 2016-12-31 LAB — COMPREHENSIVE METABOLIC PANEL
ALBUMIN: 3 g/dL — AB (ref 3.5–5.0)
ALT: 47 U/L (ref 14–54)
ANION GAP: 7 (ref 5–15)
AST: 39 U/L (ref 15–41)
Alkaline Phosphatase: 118 U/L (ref 38–126)
BUN: 24 mg/dL — ABNORMAL HIGH (ref 6–20)
CALCIUM: 8.8 mg/dL — AB (ref 8.9–10.3)
CO2: 29 mmol/L (ref 22–32)
CREATININE: 0.86 mg/dL (ref 0.44–1.00)
Chloride: 104 mmol/L (ref 101–111)
GFR calc Af Amer: >60 mL/min (ref >60)
Glucose, Bld: 90 mg/dL (ref 65–99)
POTASSIUM: 3.7 mmol/L (ref 3.5–5.1)
Sodium: 140 mmol/L (ref 135–145)
Total Bilirubin: 0.6 mg/dL (ref 0.3–1.2)
Total Protein: 6.7 g/dL (ref 6.5–8.1)

## 2016-12-31 LAB — URINALYSIS, ROUTINE W REFLEX MICROSCOPIC
Bilirubin Urine: NEGATIVE
Glucose, UA: NEGATIVE mg/dL
HGB URINE DIPSTICK: NEGATIVE
Ketones, ur: NEGATIVE mg/dL
Nitrite: NEGATIVE
PH: 6.5 (ref 5.0–8.0)
Protein, ur: NEGATIVE mg/dL
SPECIFIC GRAVITY, URINE: 1.017 (ref 1.005–1.030)

## 2016-12-31 LAB — TROPONIN I

## 2016-12-31 LAB — URINALYSIS, MICROSCOPIC (REFLEX)

## 2016-12-31 MED ORDER — DEXTROSE 5 % IV SOLN
1.0000 g | Freq: Once | INTRAVENOUS | Status: AC
  Administered 2016-12-31: 1 g via INTRAVENOUS
  Filled 2016-12-31: qty 10

## 2016-12-31 MED ORDER — ACETAMINOPHEN 500 MG PO TABS
1000.0000 mg | ORAL_TABLET | Freq: Once | ORAL | Status: AC
  Administered 2016-12-31: 1000 mg via ORAL
  Filled 2016-12-31: qty 2

## 2016-12-31 MED ORDER — IOPAMIDOL (ISOVUE-370) INJECTION 76%
100.0000 mL | Freq: Once | INTRAVENOUS | Status: AC | PRN
  Administered 2016-12-31: 100 mL via INTRAVENOUS

## 2016-12-31 MED ORDER — CIPROFLOXACIN HCL 500 MG PO TABS: 500.0000 mg | ORAL_TABLET | Freq: Two times a day (BID) | ORAL | 0 refills | Status: AC

## 2016-12-31 MED ORDER — SODIUM CHLORIDE 0.9 % IV BOLUS (SEPSIS)
500.0000 mL | Freq: Once | INTRAVENOUS | Status: AC
  Administered 2016-12-31: 500 mL via INTRAVENOUS

## 2016-12-31 MED ORDER — SODIUM CHLORIDE 0.9 % IV BOLUS (SEPSIS)
1000.0000 mL | Freq: Once | INTRAVENOUS | Status: AC
  Administered 2016-12-31: 1000 mL via INTRAVENOUS

## 2016-12-31 NOTE — ED Notes (Signed)
Patient transported to CT 

## 2016-12-31 NOTE — ED Triage Notes (Signed)
c/o feeling "tired and weak"-recent dx and tx for "kidney infection"-denies fever, n/v/d-NAD-steady gait

## 2016-12-31 NOTE — ED Notes (Signed)
Pt reports that her urinary symptoms have resolved from recent UTI. Pt reports that she has completed her full course of antibiotics. Pt's only complaint is fatigue which has been present since onset of UTI and today is acutely worse. Pt denies N/V/D or fever. Pt denies any focal neuro symptoms.

## 2016-12-31 NOTE — ED Notes (Signed)
Pt is very vague as to when symptoms started and last known normal. Pt reports the L sided heaviness and facial abnormality was prior to the weekend.

## 2016-12-31 NOTE — ED Triage Notes (Signed)
Pt refused w/c to tx area-steady gait

## 2016-12-31 NOTE — ED Provider Notes (Signed)
[facility]-EMER[facility]ENCY DEPT Provider Note   CSN: [id number] Arrival date & time: 12/31/16  1305     History   Chief Complaint Chief Complaint  Patient presents with  . Fatigue    HPI Olivia [name] is a 66 y.o. female who presents with progressively worsening generalized weakness x  1 week. Patient was seen in [facility] ED on 12/21/16 for similar symptoms and was found to have a UTI. She was discharged home with antibiotics. She says ever since then, she has continued to feel weak but that it worsened today. Patient describes her weakness as a feeling "fatigued and very tired, very heavy, like having no energy." Patient reports a baseline history of left sided weakness from prior stroke. She also reports experiencing some dyspnea on exertion, which is new in the last few days. She states that normally she is able to walk from her room in her house to the bathroom with no issue, but now she finds she has SOB when doing so. 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 No recent surgery or immobilization. She reports subjective fever/chills. She denies any CP, abdominal pain, nausea/vomiting, dysuria, hematuria. She denies any falls/trauma.   The history is provided by the patient.    Past Medical History:  Diagnosis Date  . Hypertension   . Stroke [facility])     Patient Active Problem List   Diagnosis Date Noted  . Hypertensive urgency 03/27/2014  . Left arm weakness 03/27/2014  . Headache(784.0) 03/27/2014  . UTI (urinary tract infection) 03/27/2014  . Low back pain 03/27/2014  . Muscle strain, multiple sites 07/24/2011  . Right ankle sprain 07/24/2011  . Wrist sprain 07/24/2011    Past Surgical History:  Procedure Laterality Date  . ABDOMINAL HYSTERECTOMY    . HERNIA REPAIR    . TUBAL LIGATION      OB History    No data available       Home Medications    Prior to Admission medications   Medication Sig Start Date End Date Taking? Authorizing Provider  amLODipine (NORVASC) 5 MG tablet  Take 1 tablet (5 mg total) by mouth daily. 03/28/14   Esperanza Sheets, MD  aspirin EC 81 MG tablet Take 1 tablet (81 mg total) by mouth daily. 03/28/14   Esperanza Sheets, MD  B Complex-C (B-COMPLEX WITH VITAMIN C) tablet Take 1 tablet by mouth daily.    Historical Provider, MD  chlorthalidone (HYGROTON) 25 MG tablet Take 25 mg by mouth daily.    Historical Provider, MD  cholecalciferol (VITAMIN D) 1000 UNITS tablet Take 1,000 Units by mouth daily.    Historical Provider, MD  ciprofloxacin (CIPRO) 500 MG tablet Take 1 tablet (500 mg total) by mouth 2 (two) times daily. 01/01/17 01/08/17  Loren Racer, MD  MAGNESIUM PO Take 1 tablet by mouth daily.    Historical Provider, MD  potassium gluconate 595 MG TABS tablet Take 595 mg by mouth daily.    Historical Provider, MD  Pyridoxine HCl (VITAMIN B-6 PO) Take 1 tablet by mouth daily.     Historical Provider, MD  VITAMIN E PO Take 1 tablet by mouth daily.    Historical Provider, MD    Family History Family History  Problem Relation Age of Onset  . Hypertension Mother   . Diabetes Sister   . Hypertension Brother   . Hypertension Maternal Aunt   . Heart attack Neg Hx   . Hyperlipidemia Neg Hx   . Sudden death Neg Hx  Social History Social History  Substance Use Topics  . Smoking status: Never Smoker  . Smokeless tobacco: Never Used  . Alcohol use No     Allergies   Patient has no known allergies.   Review of Systems Review of Systems  Constitutional: Positive for activity change, chills, fatigue and fever.  Eyes: Negative for visual disturbance.  Respiratory: Positive for shortness of breath. Negative for cough.   Cardiovascular: Negative for chest pain and leg swelling.  Gastrointestinal: Negative for abdominal pain, diarrhea and nausea.  Genitourinary: Negative for dysuria and hematuria.  Musculoskeletal: Negative for back pain.  Skin: Negative for rash.  Neurological: Positive for weakness, light-headedness and numbness.  Negative for speech difficulty and headaches.  All other systems reviewed and are negative.    Physical Exam Updated Vital Signs BP 137/83   Pulse (!) 55   Temp 98.4 F (36.9 C) (Oral)   Resp 17   Ht  (1.753 m)   Wt 97.1 kg   SpO2 99%   BMI 31.60 kg/m   Physical Exam  Constitutional: She is oriented to person, place, and time. She appears well-developed and well-nourished.  HENT:  Head: Normocephalic and atraumatic.  Mouth/Throat: Oropharynx is clear and moist and mucous membranes are normal.  Eyes: Conjunctivae, EOM and lids are normal. Pupils are equal, round, and reactive to light.  Neck: Full passive range of motion without pain.  Cardiovascular: Normal rate, regular rhythm, normal heart sounds and normal pulses.  Exam reveals no gallop and no friction rub.   No murmur heard. Pulmonary/Chest: Effort normal and breath sounds normal.  Abdominal: Soft. Normal appearance. There is tenderness in the suprapubic area and left lower quadrant. There is no rigidity, no guarding and no CVA tenderness.  Musculoskeletal: Normal range of motion.  No edema to bilateral lower extremities  Neurological: She is alert and oriented to person, place, and time.  Slight left sided facial droop when smiling (though patient reports that she has pain and difficulty on that side from prior surgery), otherwise CN III-XII intact.  Follows commands, Moves all extremities  5/5 strength to BUE and BLE  Sensation intact throughout  Normal finger to nose. No dysdiadochokinesia. No pronator drift No slurred speech.   Skin: Skin is warm and dry. Capillary refill takes less than 2 seconds.  Psychiatric: She has a normal mood and affect. Her speech is normal.  Nursing note and vitals reviewed.    ED Treatments / Results  Labs (all labs ordered are listed, but only abnormal results are displayed) Labs Reviewed  URINE CULTURE - Abnormal; Notable for the following:       Result Value   Culture    (*)    Value: <10,000 COLONIES/mL INSIGNIFICANT GROWTH Performed at [facility], [address], [place], [place] [address]    All other components within normal limits  URINALYSIS, ROUTINE W REFLEX MICROSCOPIC - Abnormal; Notable for the following:    Leukocytes, UA MODERATE (*)    All other components within normal limits  URINALYSIS, MICROSCOPIC (REFLEX) - Abnormal; Notable for the following:    Bacteria, UA RARE (*)    Squamous Epithelial / LPF 0-5 (*)    All other components within normal limits  COMPREHENSIVE METABOLIC PANEL - Abnormal; Notable for the following:    BUN 24 (*)    Calcium 8.8 (*)    Albumin 3.0 (*)    All other components within normal limits  CBC WITH DIFFERENTIAL/PLATELET  TROPONIN I  EKG  EKG Interpretation  Date/Time:  Wednesday December 31 2016 14:12:19 EDT Ventricular Rate:  68 PR Interval:    QRS Duration: 91 QT Interval:  419 QTC Calculation: 446 R Axis:   5 Text Interpretation:  Sinus rhythm Atrial premature complex Borderline prolonged PR interval Anterior infarct, old No significant change since last tracing Confirmed by YAO  MD, DAVID ([id number]) on 12/31/2016 3:13:52 PM       Radiology Dg Chest 2 View  Result Date: 12/31/2016 CLINICAL DATA:  Patient reports fatigue and weakness, recently treated for kidney infection. History of previous CVA EXAM: CHEST  2 VIEW COMPARISON:  Chest x-ray of December 21, 2016 FINDINGS: The lungs are adequately inflated and clear. The heart and pulmonary vascularity are normal. The mediastinum is normal in width. There is no pleural effusion. The bony thorax exhibits no acute abnormality. IMPRESSION: There is no pneumonia, CHF, nor other acute cardiopulmonary abnormality. Electronically Signed   By: David  [place] M.D.   On: 12/31/2016 15:33   Ct Head Wo Contrast  Result Date: 12/31/2016 CLINICAL DATA:  Persistent LEFT facial droop and LEFT-sided weakness since July 2017. History of hypertension and stroke. EXAM: CT  HEAD WITHOUT CONTRAST TECHNIQUE: Contiguous axial images were obtained from the base of the skull through the vertex without intravenous contrast. COMPARISON:  CT HEAD December 21, 2016 FINDINGS: BRAIN: No intraparenchymal hemorrhage, mass effect nor midline shift. The ventricles and sulci are normal for age. No acute large vascular territory infarcts. No abnormal extra-axial fluid collections. Basal cisterns are patent. VASCULAR: Unremarkable. SKULL: No skull fracture. No significant scalp soft tissue swelling. SINUSES/ORBITS: The mastoid air-cells and included paranasal sinuses are well-aerated.The included ocular globes and orbital contents are non-suspicious. OTHER: None. IMPRESSION: Stable examination:  Negative CT HEAD. Electronically Signed   By: Awilda Metro M.D.   On: 12/31/2016 15:45   Ct Angio Chest Pe W And/or Wo Contrast  Result Date: 12/31/2016 CLINICAL DATA:  Shortness of Breath EXAM: CT ANGIOGRAPHY CHEST WITH CONTRAST TECHNIQUE: Multidetector CT imaging of the chest was performed using the standard protocol during bolus administration of intravenous contrast. Multiplanar CT image reconstructions and MIPs were obtained to evaluate the vascular anatomy. CONTRAST:  100 mL Isovue 370 nonionic COMPARISON:  Chest CT March 15, 2015 and chest radiograph December 31, 2016 FINDINGS: Cardiovascular: There is no appreciable pulmonary embolus. There is no thoracic aortic aneurysm or dissection. Pericardium is not appreciably thickened. Mediastinum/Nodes: Visualized thyroid appears normal. There is no appreciable thoracic adenopathy. Lungs/Pleura: There is patchy bibasilar atelectasis. On axial slice 44 series 5, there is a 3 mm nodular opacity in the medial segment right middle lobe. Lungs elsewhere are clear. No edema or consolidation. There is no pleural effusion or pleural thickening evident. Upper Abdomen: Visualized upper abdominal structures appear unremarkable. Musculoskeletal: There are no blastic or  lytic bone lesions. No chest wall lesions are evident. Review of the MIP images confirms the above findings. IMPRESSION: No evident pulmonary embolus. Mild atelectatic change in the bases. No edema or consolidation. There is a 3 mm nodular opacity in the right middle lobe medial segment. No follow-up needed if patient is low-risk. Non-contrast chest CT can be considered in 12 months if patient is high-risk. This recommendation follows the consensus statement: Guidelines for Management of Incidental Pulmonary Nodules Detected on CT Images: From the Fleischner Society 2017; Radiology 2017; 284:228-243. No evident adenopathy. Electronically Signed   By: Bretta Bang III M.D.   On: 12/31/2016 15:56    Procedures Procedures (  including critical care time)  Medications Ordered in ED Medications  sodium chloride 0.9 % bolus 1,000 mL (0 mLs Intravenous Stopped 12/31/16 1518)  iopamidol (ISOVUE-370) 76 % injection 100 mL (100 mLs Intravenous Contrast Given 12/31/16 1524)  acetaminophen (TYLENOL) tablet 1,000 mg (1,000 mg Oral Given 12/31/16 1724)  cefTRIAXone (ROCEPHIN) 1 g in dextrose 5 % 50 mL IVPB (0 g Intravenous Stopped 12/31/16 1852)  sodium chloride 0.9 % bolus 500 mL (0 mLs Intravenous Stopped 12/31/16 1916)     Initial Impression / Assessment and Plan / ED Course  I have reviewed the triage vital signs and the nursing notes.  Pertinent labs & imaging results that were available during my care of the patient were reviewed by me and considered in my medical decision making (see chart for details).     66 yo F recently treated for a UTI presents with generalized weakness that she describes as being "fatigued and tired." She feels like this has been ongoing since she was treated for the UTI on 12/21/16. Also with some history of increasing dyspnea on exertion. Reports compliance with antibiotics. Physical exam with some LLQ/suprapubic tenderness that may be residual from UTI. Neuro exam shows slight  facial droop on the left side but unclear if that is new or due to patient's prior history of pain and surgery to the same side. Per patient she has also has a history of left sided weakness at baseline, but she has equal strength today. Consider weakness secondary to persistent UTI vs infectious etiology vs electrolyte abnormality vs cardiac etiology.Given symptoms and history, will plan to check CT head to eval for any acute hemorrhagic process. Will also check CT chest to eval for PE. Basic labs checked including CBC, CMP, UA, EKG, Troponin.   Prior MRI reviewed and negative for CVA. Today's labs and imaging reviewed. CT head and CT chest with no acute abnormality. Labs within normal limits, though UA is +LE and +WBCs which could indicate lingering UTI. Labs and imaging reviewed with patient. Will plan to ambulate patient to evaluate O2 status and DOE.   Patient walked in the department and maintained adequate O2 sats with no subjective complaints of dyspnea from patient. Given labs and imaging results, weakness most likely related to continued infectious etiology secondary to UTI/pyelo. Plan to treat patient with IV antibiotics in the department.   Patient signed out to supervising attending at end of shift pending IV antibiotic completion.   Final Clinical Impressions(s) / ED Diagnoses   Final diagnoses:  Fatigue, unspecified type  Recurrent UTI (urinary tract infection)    New Prescriptions Discharge Medication List as of 12/31/2016  6:47 PM    START taking these medications   Details  ciprofloxacin (CIPRO) 500 MG tablet Take 1 tablet (500 mg total) by mouth 2 (two) times daily., Starting Thu 01/01/2017, Until Thu 01/08/2017, Print         Maxwell Caul, PA-C 01/01/17 1925    Charlynne Pander, MD 01/02/17 [phone]

## 2016-12-31 NOTE — ED Notes (Signed)
PT states understanding of care given, follow up care, and medication prescribed. PT ambulated from ED to car with a steady gait. 

## 2016-12-31 NOTE — ED Notes (Signed)
ED Provider at bedside. 

## 2016-12-31 NOTE — ED Notes (Signed)
Patient ambulated in hall on room air, no DOE noted, HR 70-75, SpO2 99-100%, RR 20-22. Subjective complaint of just "tired"

## 2016-12-31 NOTE — ED Provider Notes (Signed)
Signed out from [place]. Patient with recently diagnosed pyelonephritis. Completed a course of one week of Keflex. States she is having some urinary frequency but most of her pain associated with the pyelonephritis has resolved. She does have some suprapubic discomfort and pain with deep palpation in the mid abdomen. She has a normal white blood cell count and is afebrile. Repeat UA with moderate leukocytes and 6-30 white blood cells. Likely partially treated UTI/pyelonephritis. Given a dose of IV Rocephin in the emergency department. Urine was sent for culture. Discharge home with Cipro and strict return precautions been given. Patient has a history of left-sided weakness and numbness. She states that this has been constant since diagnosed with tonsillitis last week. She has no reproducible weakness or facial droop. Sensation is intact. Low suspicion for for acute CVA. Do not believe that further imaging is necessary at this point.    Loren Racer, MD 12/31/16 [phone]

## 2017-01-01 LAB — URINE CULTURE

## 2017-01-01 MED FILL — CIPROFLOXACIN HCL 500 MG TA: 500 | 7 days supply | Qty: 14 | Fill #0

## 2017-12-04 IMAGING — CR DG CHEST 2V
2 series · 2 of 2 positions shown · non-contrast
Comparison: Chest x-ray of December 21, 2016

CLINICAL DATA: Patient reports fatigue and weakness, recently
treated for kidney infection. History of previous CVA

EXAM:
CHEST  2 VIEW

[w chest pa]
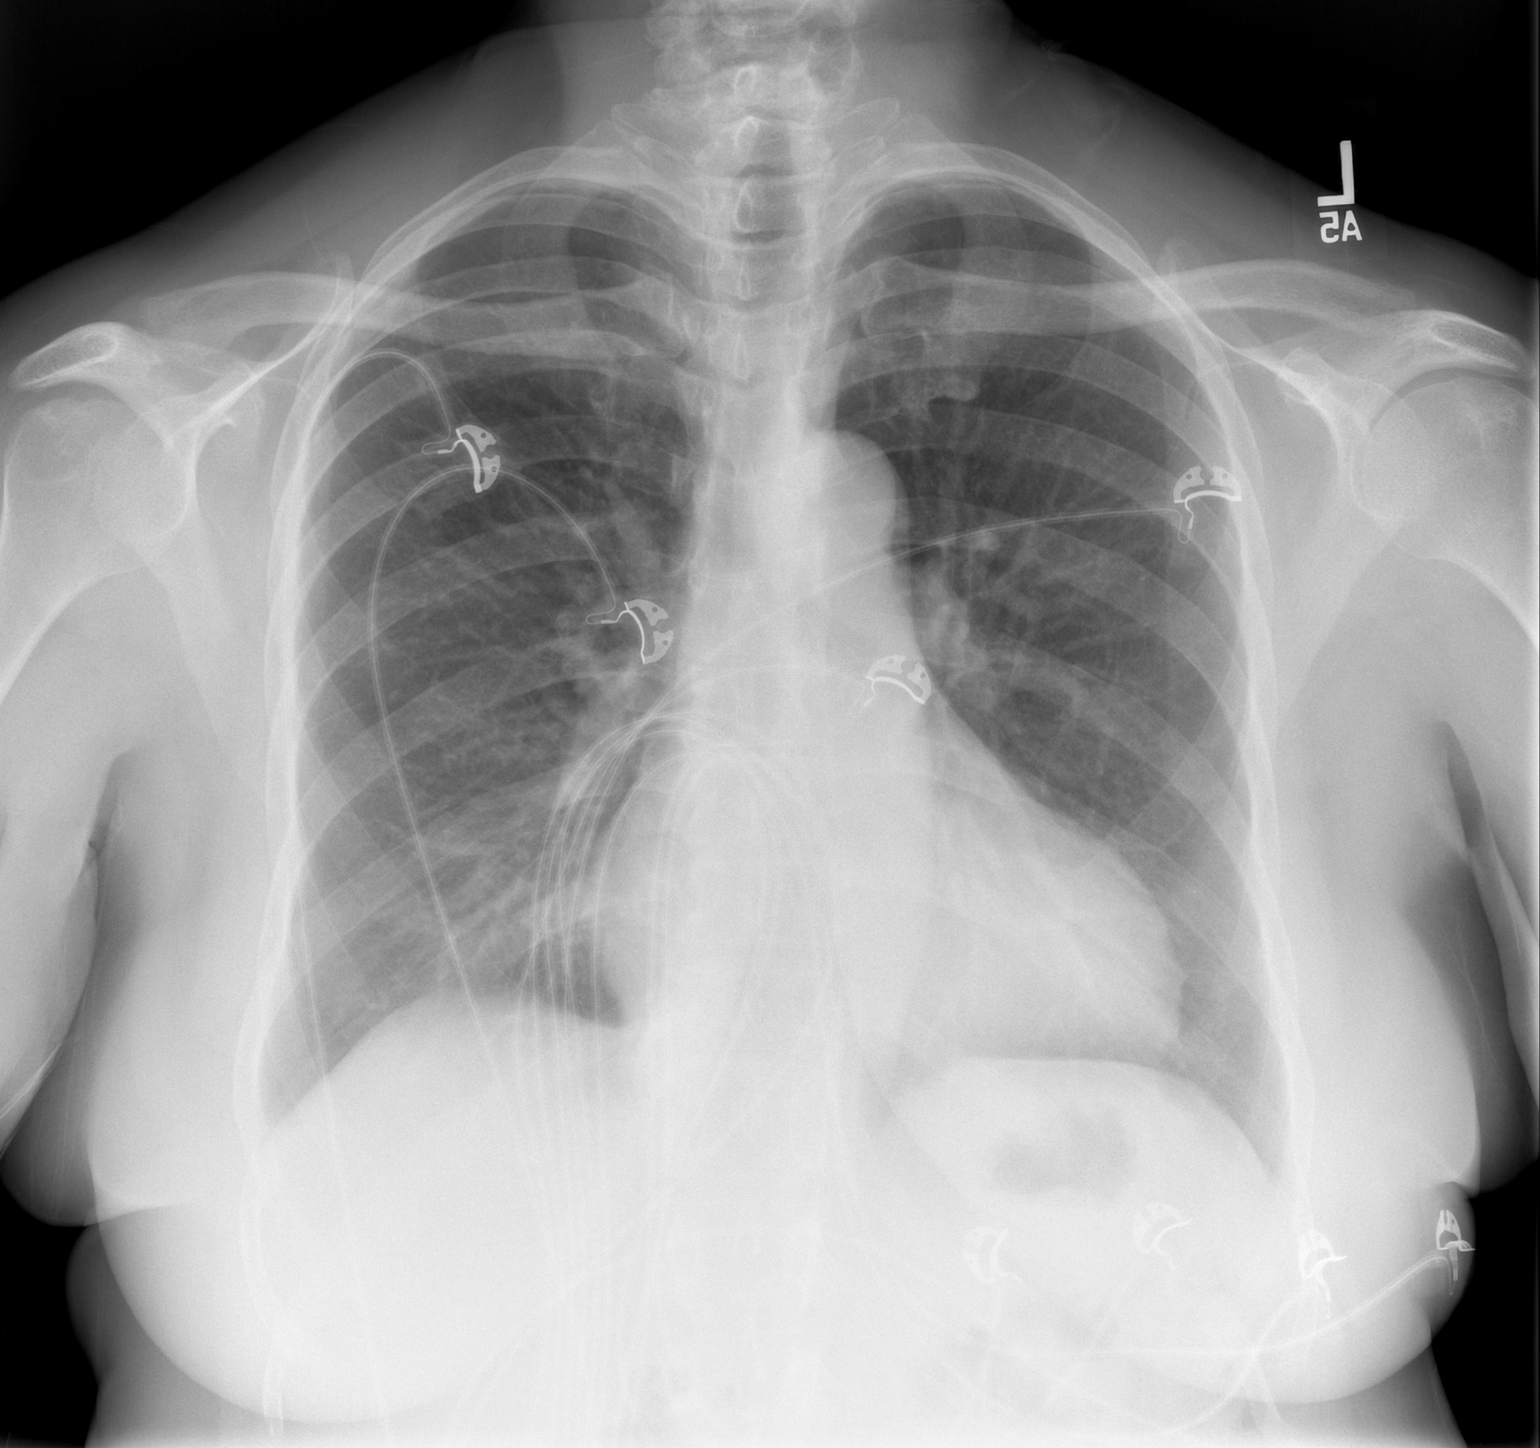

[w chest lat]
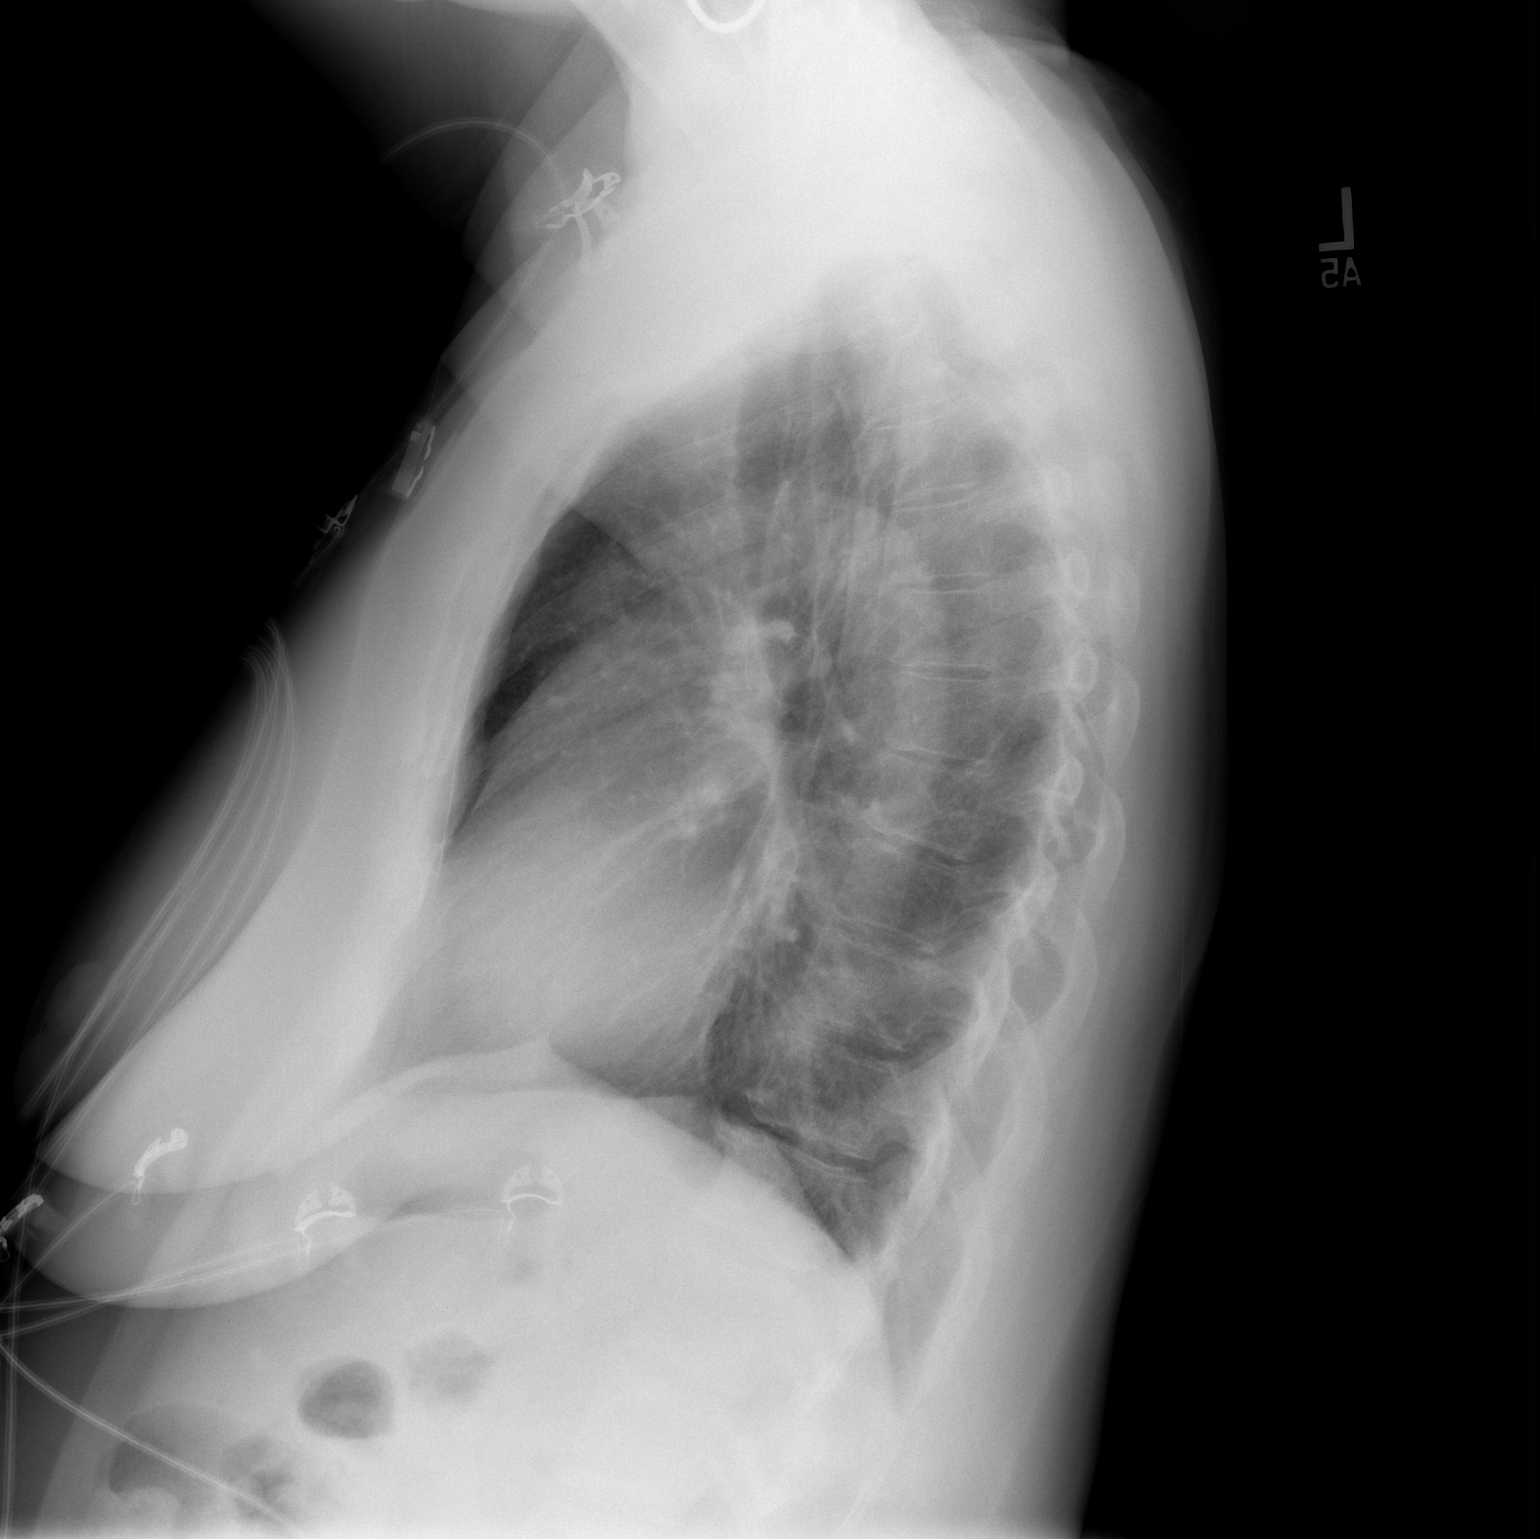

[2 of 2 positions shown; findings below may reference images not displayed]

FINDINGS: The lungs are adequately inflated and clear. The heart and pulmonary
vascularity are normal. The mediastinum is normal in width. There is
no pleural effusion. The bony thorax exhibits no acute abnormality.
IMPRESSION: There is no pneumonia, CHF, nor other acute cardiopulmonary
abnormality.

## 2017-12-04 IMAGING — CT CT HEAD W/O CM
3 series · 16 of 47 positions shown, 19 images · non-contrast
Comparison: CT HEAD December 21, 2016

CLINICAL DATA: Persistent LEFT facial droop and LEFT-sided weakness
since March 2016. History of hypertension and stroke.

EXAM:
CT HEAD WITHOUT CONTRAST
TECHNIQUE: Contiguous axial images were obtained from the base of the skull
through the vertex without intravenous contrast.

[Series 2: head wo · axial · 0.41mm/px · z∈[-182,-52]mm · 10 of 32 slices shown, 13 images]
[im 3/32  brain]
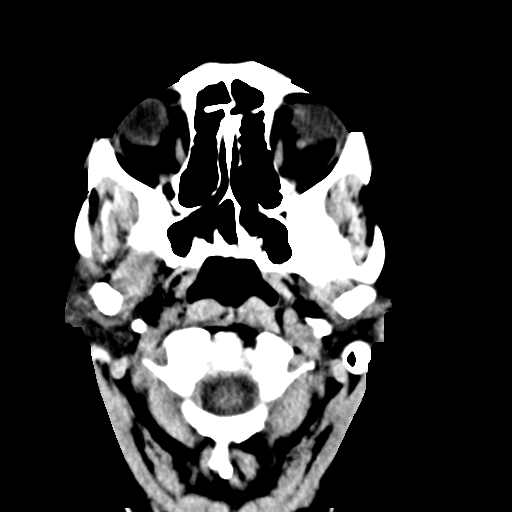
[im 3/32  bone]
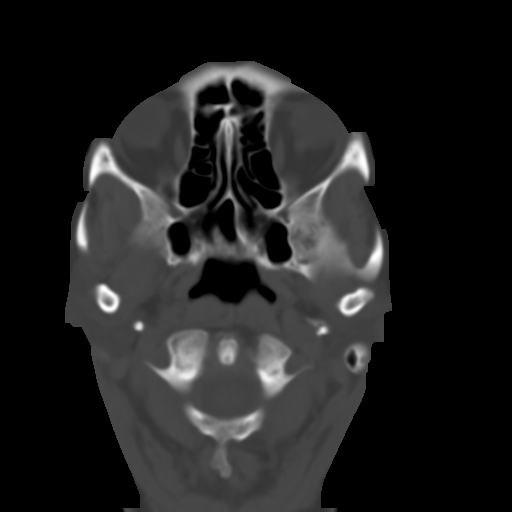
[im 6/32  brain]
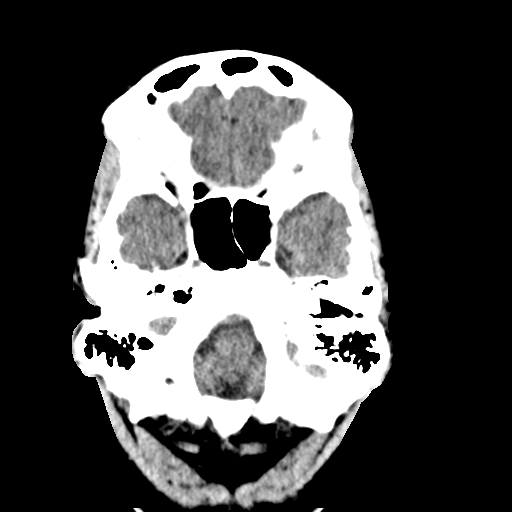
[im 9/32  brain]
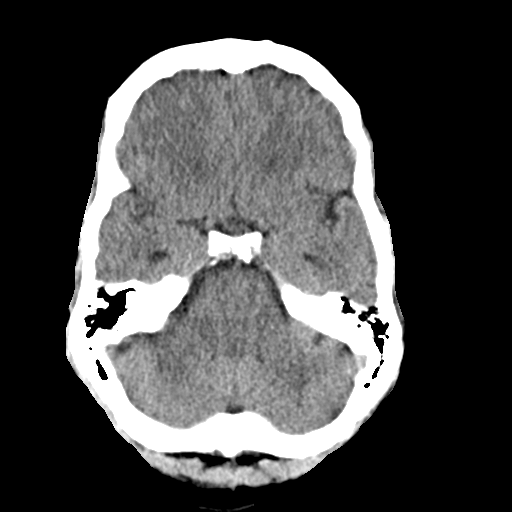
[im 11/32  brain]
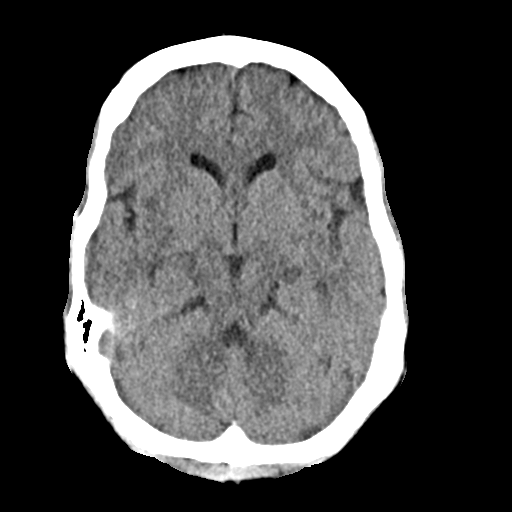
[im 14/32  brain]
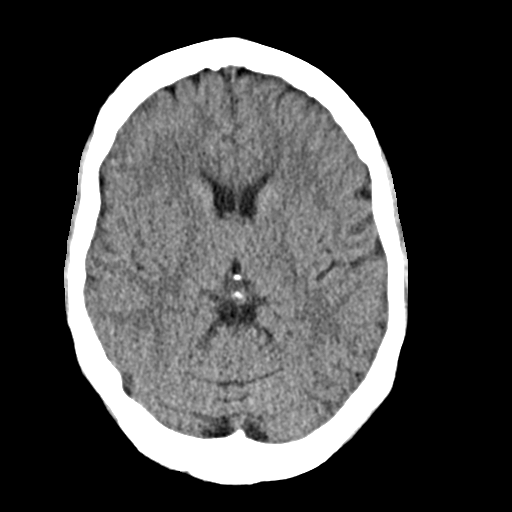
[im 14/32  bone]
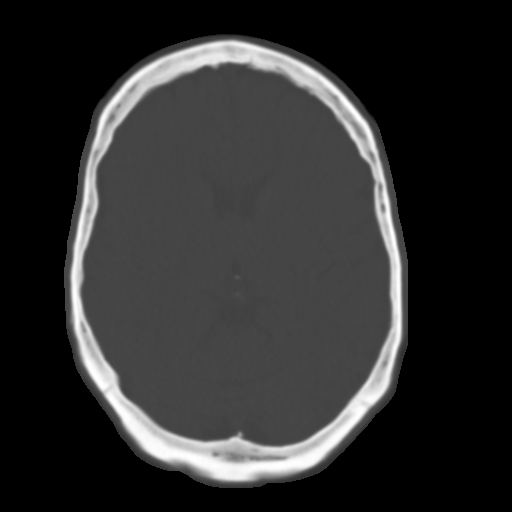
[im 18/32  brain]
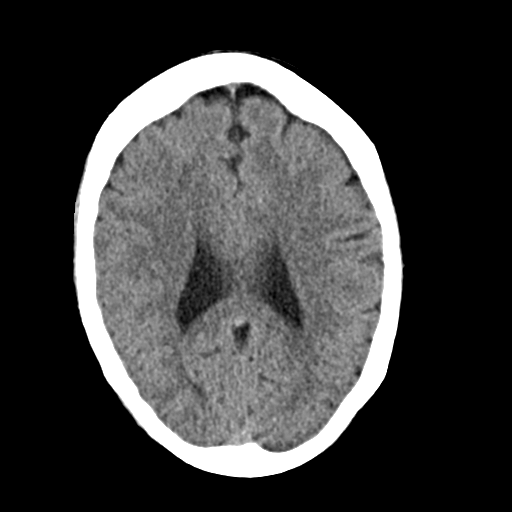
[im 21/32  brain]
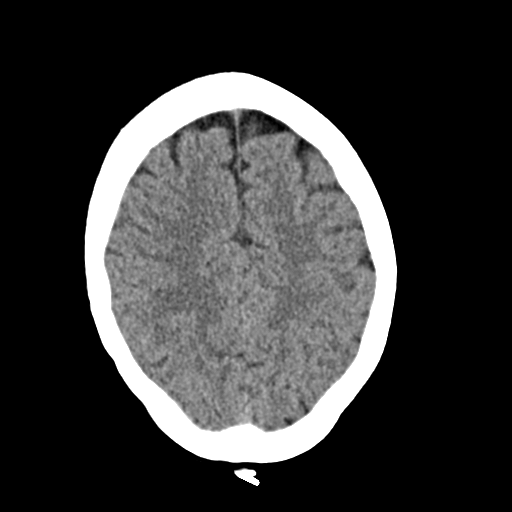
[im 24/32  brain]
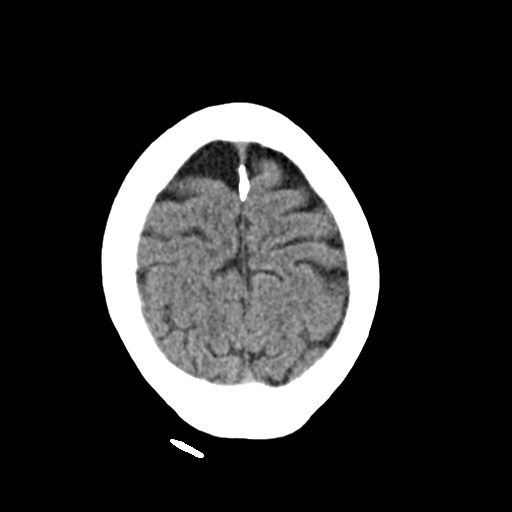
[im 26/32  brain]
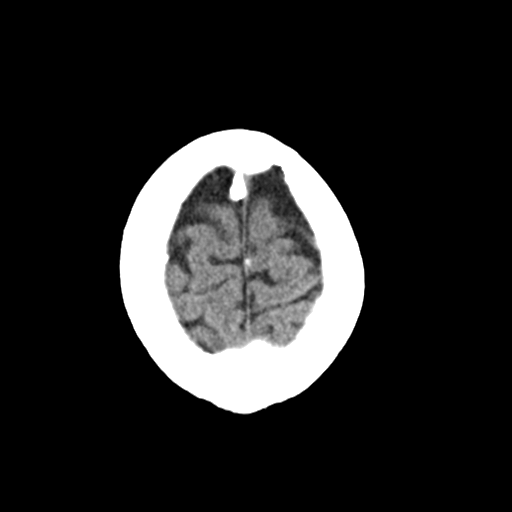
[im 26/32  bone]
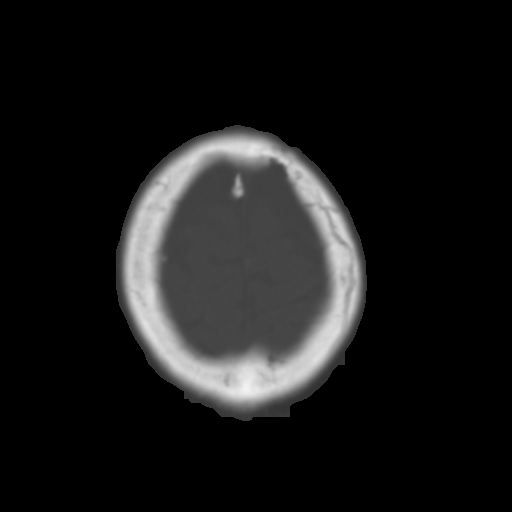
[im 29/32  brain]
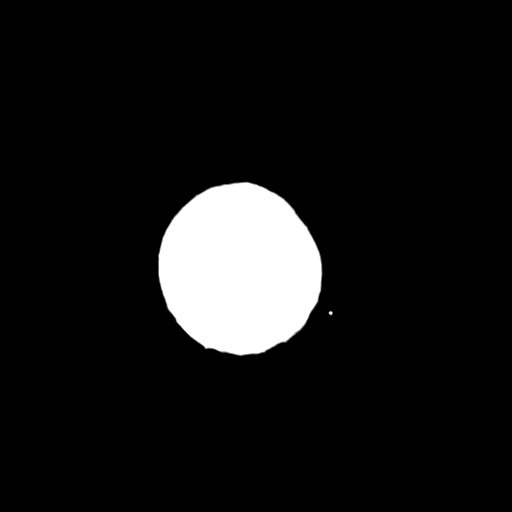

[Series 4: coronal soft · coronal · 0.30mm/px · 3 of 66 slices shown]
[im 22/66  brain]
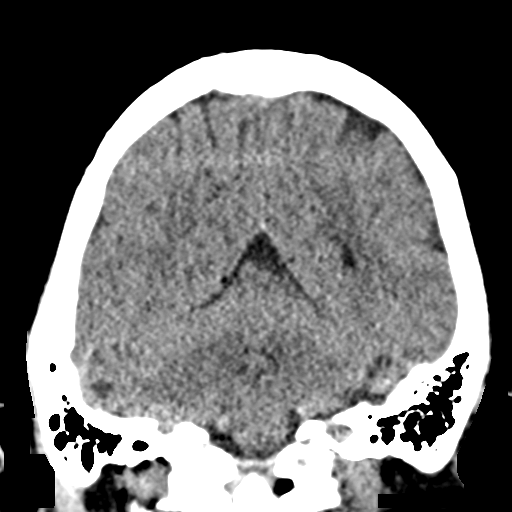
[im 29/66  brain]
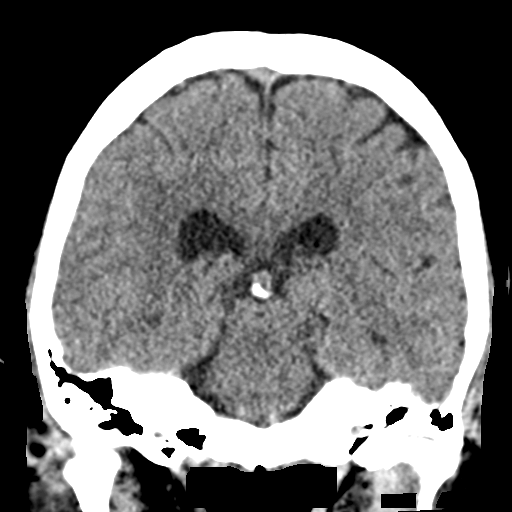
[im 37/66  brain]
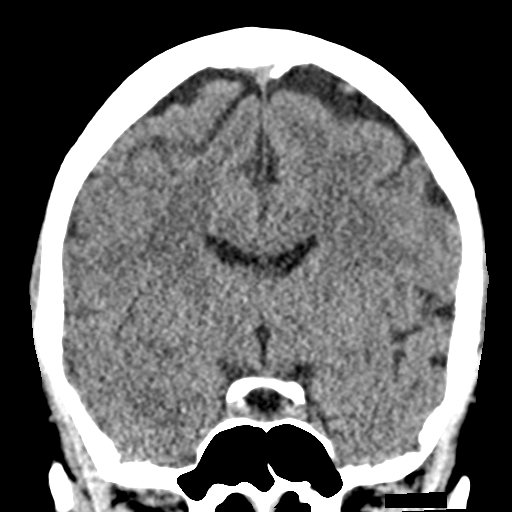

[Series 5: sag soft · sagittal · 0.32mm/px · 3 of 53 slices shown]
[im 18/53  brain]
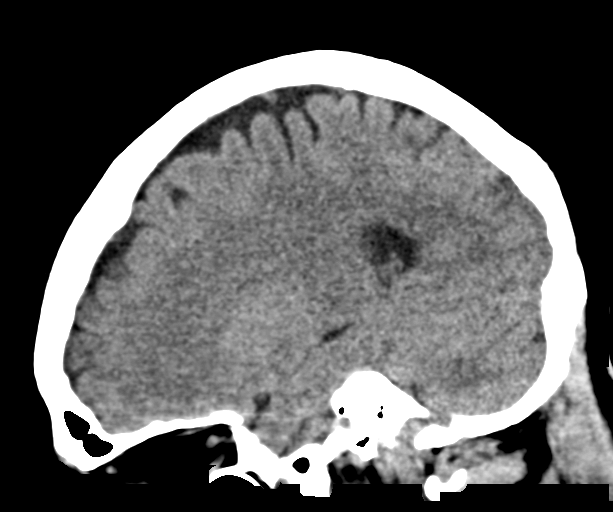
[im 27/53  brain]
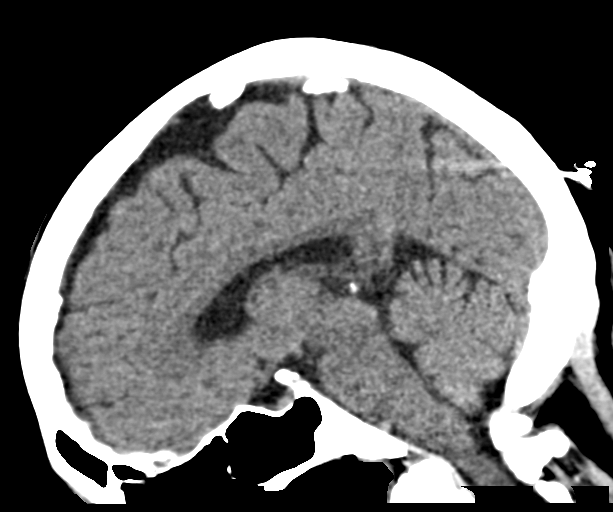
[im 35/53  brain]
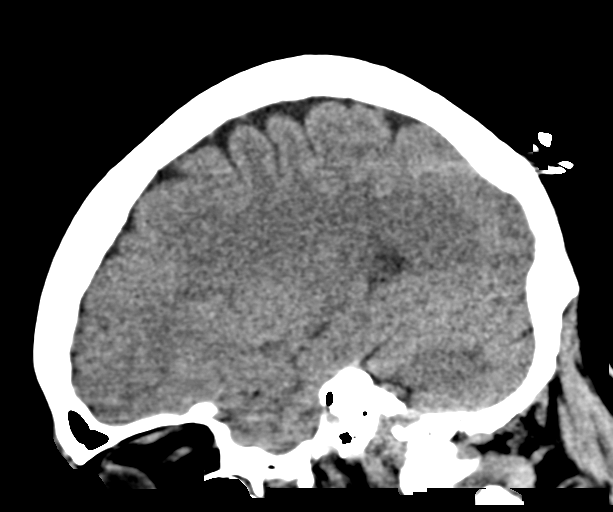

[16 of 47 positions shown; findings below may reference images not displayed]

FINDINGS: BRAIN: No intraparenchymal hemorrhage, mass effect nor midline
shift. The ventricles and sulci are normal for age. No acute large
vascular territory infarcts. No abnormal extra-axial fluid
collections. Basal cisterns are patent.

VASCULAR: Unremarkable.

SKULL: No skull fracture. No significant scalp soft tissue swelling.

SINUSES/ORBITS: The mastoid air-cells and included paranasal sinuses
are well-aerated.The included ocular globes and orbital contents are
non-suspicious.

OTHER: None.
IMPRESSION: Stable examination:  Negative CT HEAD.

## 2018-03-03 ENCOUNTER — Encounter (HOSPITAL_BASED_OUTPATIENT_CLINIC_OR_DEPARTMENT_OTHER): Payer: Self-pay

## 2018-03-03 ENCOUNTER — Other Ambulatory Visit: Payer: Self-pay

## 2018-03-03 ENCOUNTER — Emergency Department (HOSPITAL_BASED_OUTPATIENT_CLINIC_OR_DEPARTMENT_OTHER)
Admission: EM | Admit: 2018-03-03 | Discharge: 2018-03-03 | Disposition: A | Discharge status: Home / Self Care | Payer: Medicare Other | Attending: Emergency Medicine | Admitting: Emergency Medicine

## 2018-03-03 ENCOUNTER — Emergency Department (HOSPITAL_BASED_OUTPATIENT_CLINIC_OR_DEPARTMENT_OTHER): Payer: Medicare Other

## 2018-03-03 DIAGNOSIS — Z79899 Other long term (current) drug therapy: Secondary | ICD-10-CM | POA: Diagnosis not present

## 2018-03-03 DIAGNOSIS — I10 Essential (primary) hypertension: Secondary | ICD-10-CM | POA: Diagnosis not present

## 2018-03-03 DIAGNOSIS — Z8673 Personal history of transient ischemic attack (TIA), and cerebral infarction without residual deficits: Secondary | ICD-10-CM | POA: Diagnosis not present

## 2018-03-03 DIAGNOSIS — I82441 Acute embolism and thrombosis of right tibial vein: Secondary | ICD-10-CM | POA: Insufficient documentation

## 2018-03-03 DIAGNOSIS — Z7982 Long term (current) use of aspirin: Secondary | ICD-10-CM | POA: Insufficient documentation

## 2018-03-03 DIAGNOSIS — M79661 Pain in right lower leg: Secondary | ICD-10-CM | POA: Diagnosis present

## 2018-03-03 DIAGNOSIS — M79669 Pain in unspecified lower leg: Secondary | ICD-10-CM

## 2018-03-03 HISTORY — DX: Hypoglycemia, unspecified: E16.2

## 2018-03-03 LAB — CBC WITH DIFFERENTIAL/PLATELET
Basophils Absolute: 0 10*3/uL (ref 0.0–0.1)
Basophils Relative: 0 %
EOS PCT: 1 %
Eosinophils Absolute: 0.1 10*3/uL (ref 0.0–0.7)
HCT: 39.9 % (ref 36.0–46.0)
HEMOGLOBIN: 14 g/dL (ref 12.0–15.0)
LYMPHS ABS: 1.3 10*3/uL (ref 0.7–4.0)
LYMPHS PCT: 16 %
MCH: 32.2 pg (ref 26.0–34.0)
MCHC: 35.1 g/dL (ref 30.0–36.0)
MCV: 91.7 fL (ref 78.0–100.0)
MONOS PCT: 7 %
Monocytes Absolute: 0.6 10*3/uL (ref 0.1–1.0)
Neutro Abs: 6.1 10*3/uL (ref 1.7–7.7)
Neutrophils Relative %: 76 %
PLATELETS: 183 10*3/uL (ref 150–400)
RBC: 4.35 MIL/uL (ref 3.87–5.11)
RDW: 12.9 % (ref 11.5–15.5)
WBC: 8.1 10*3/uL (ref 4.0–10.5)

## 2018-03-03 LAB — BASIC METABOLIC PANEL
Anion gap: 8 (ref 5–15)
BUN: 17 mg/dL (ref 6–20)
CO2: 30 mmol/L (ref 22–32)
Calcium: 9.7 mg/dL (ref 8.9–10.3)
Chloride: 104 mmol/L (ref 101–111)
Creatinine, Ser: 0.71 mg/dL (ref 0.44–1.00)
GFR calc Af Amer: >60 mL/min (ref >60)
GFR calc non Af Amer: >60 mL/min (ref >60)
GLUCOSE: 93 mg/dL (ref 65–99)
POTASSIUM: 3.8 mmol/L (ref 3.5–5.1)
Sodium: 142 mmol/L (ref 135–145)

## 2018-03-03 LAB — TROPONIN I: Troponin I: <0.03 ng/mL (ref <0.03)

## 2018-03-03 MED ORDER — CLONIDINE HCL 0.1 MG PO TABS
0.1000 mg | ORAL_TABLET | Freq: Once | ORAL | Status: DC
Start: 2018-03-03 — End: 2018-03-03

## 2018-03-03 MED ORDER — RIVAROXABAN (XARELTO) VTE STARTER PACK (15 & 20 MG): ORAL_TABLET | ORAL | 0 refills | Status: AC

## 2018-03-03 MED ORDER — CLONIDINE HCL 0.1 MG PO TABS
0.1000 mg | ORAL_TABLET | Freq: Once | ORAL | Status: AC
  Administered 2018-03-03: 0.1 mg via ORAL
  Filled 2018-03-03: qty 1

## 2018-03-03 MED ORDER — RIVAROXABAN (XARELTO) EDUCATION KIT FOR DVT/PE PATIENTS
PACK | Freq: Once | Status: AC
  Administered 2018-03-03: 17:00:00

## 2018-03-03 NOTE — ED Triage Notes (Signed)
Pt c/o pain to right LE started last night-states [facility] and elevated BP-pt NAD-slow gait

## 2018-03-03 NOTE — Discharge Instructions (Addendum)
Follow-up with your PCP soon to discuss DVT (blood clot in leg).

## 2018-03-03 NOTE — ED Provider Notes (Signed)
[place] EMERGENCY DEPARTMENT Provider Note  CSN: [id number] Arrival date & time: 03/03/18  1134  History   Chief Complaint Chief Complaint  Patient presents with  . Leg Pain   HPI Olivia [name] is a 67 y.o. female with a medical history of HTN, HLD,  Stroke and vascular dementia who presented to the ED for elevated BP and leg pain. Patient states that her home health aide noticed swelling in her right lower leg yesterday and encouraged her to come to the ED to be evaluated. She also states that her BP was elevated yesterday at ~160/90, but is unsure of the exact recording. She is unable to state what her regular BP is.   Denies vision changes, headache, chest pain, SOB, cough/hemoptysis and palpitations. Patient states that she has had bilateral leg pain for the last couple of days and feels achy. She has been able to ambulate and bear weight without issue. Denies erythema, warmth, rash, arthralgias or joint swelling. Denies recent trauma or injury. Patient has tried nothing prior to coming to the ED. Overall patient states "I've been feeling pretty good." Endorses positive sleep and appetite.  Additional history obtained by medical chart. Patient hospitalized at [facility] 02/17/18-02/19/18 for hypoglycemia and has been receiving home health since discharge.  Past Medical History:  Diagnosis Date  . Hypertension   . Hypoglycemia   . Stroke [facility])     Patient Active Problem List   Diagnosis Date Noted  . Hypertensive urgency 03/27/2014  . Left arm weakness 03/27/2014  . Headache(784.0) 03/27/2014  . UTI (urinary tract infection) 03/27/2014  . Low back pain 03/27/2014  . Muscle strain, multiple sites 07/24/2011  . Right ankle sprain 07/24/2011  . Wrist sprain 07/24/2011    Past Surgical History:  Procedure Laterality Date  . ABDOMINAL HYSTERECTOMY    . HERNIA REPAIR    . TUBAL LIGATION       OB History   None      Home Medications    Prior to Admission  medications   Medication Sig Start Date End Date Taking? Authorizing Provider  amLODipine (NORVASC) 5 MG tablet Take 1 tablet (5 mg total) by mouth daily. 03/28/14   Kinnie Feil, MD  aspirin EC 81 MG tablet Take 1 tablet (81 mg total) by mouth daily. 03/28/14   Kinnie Feil, MD  B Complex-C (B-COMPLEX WITH VITAMIN C) tablet Take 1 tablet by mouth daily.    [provider]  chlorthalidone (HYGROTON) 25 MG tablet Take 25 mg by mouth daily.    [provider]  cholecalciferol (VITAMIN D) 1000 UNITS tablet Take 1,000 Units by mouth daily.    [provider]  MAGNESIUM PO Take 1 tablet by mouth daily.    [provider]  potassium gluconate 595 MG TABS tablet Take 595 mg by mouth daily.    [provider]  Pyridoxine HCl (VITAMIN B-6 PO) Take 1 tablet by mouth daily.     [provider]  Rivaroxaban 15 & 20 MG TBPK Take as directed on package: Start with one 65m tablet by mouth twice a day with food. On Day 22, switch to one 227mtablet once a day with food. 03/03/18   Jarris Kortz, GaAlvie Heidelberg, PA-C  VITAMIN E PO Take 1 tablet by mouth daily.    [provider]    Family History Family History  Problem Relation Age of Onset  . Hypertension Mother   . Diabetes Sister   . Hypertension  Brother   . Hypertension Maternal Aunt   . Heart attack Neg Hx   . Hyperlipidemia Neg Hx   . Sudden death Neg Hx     Social History Social History   Tobacco Use  . Smoking status: Never Smoker  . Smokeless tobacco: Never Used  Substance Use Topics  . Alcohol use: No  . Drug use: No     Allergies   Patient has no known allergies.   Review of Systems Review of Systems  Constitutional: Negative.  Negative for activity change, appetite change, diaphoresis and fever.  HENT: Negative.   Eyes: Negative.   Respiratory: Negative for cough, chest tightness, shortness of breath and wheezing.   Cardiovascular: Positive for leg swelling.  Negative for chest pain and palpitations.  Gastrointestinal: Negative for abdominal pain, constipation, diarrhea, nausea, rectal pain and vomiting.  Genitourinary: Negative.  Negative for difficulty urinating and dysuria.  Musculoskeletal: Negative.   Skin: Negative.   Neurological: Negative.      Physical Exam Updated Vital Signs BP (!) 154/82   Pulse 62   Temp 98.2 F (36.8 C) (Oral)   Resp 14   Ht '5\' 9"'  (1.753 m)   Wt 106.5 kg (234 lb 12.6 oz)   SpO2 99%   BMI 34.67 kg/m   Physical Exam  Constitutional: She is oriented to person, place, and time. She appears well-developed and well-nourished. No distress.  Eyes: Pupils are equal, round, and reactive to light. Conjunctivae, EOM and lids are normal.  Neck: Normal range of motion and full passive range of motion without pain. Neck supple.  Cardiovascular: Normal rate, regular rhythm, normal heart sounds and intact distal pulses.  No murmur heard. Pulses:      Posterior tibial pulses are 2+ on the right side, and 2+ on the left side.  Pulmonary/Chest: Effort normal and breath sounds normal.  Abdominal: Soft. Normal appearance and bowel sounds are normal. There is no tenderness.  Musculoskeletal:       Right lower leg: Normal.       Left lower leg: Normal.  No appreciable swelling seen on exam. Full ROM of lower extremities bilaterally.  Neurological: She is alert and oriented to person, place, and time. She has normal strength. No sensory deficit. She exhibits normal muscle tone. Gait normal.  Decreased lower extremity reflexes.  Skin: Skin is warm, dry and intact. No rash noted.  Nursing note and vitals reviewed.    ED Treatments / Results  Labs (all labs ordered are listed, but only abnormal results are displayed) Labs Reviewed  CBC WITH DIFFERENTIAL/PLATELET  TROPONIN I  BASIC METABOLIC PANEL    EKG None  Radiology US Venous Img Lower Unilateral Right  Result Date: 03/03/2018 CLINICAL DATA:  Left leg pain  edema EXAM: LEFT LOWER EXTREMITY VENOUS DOPPLER ULTRASOUND TECHNIQUE: Gray-scale sonography with graded compression, as well as color Doppler and duplex ultrasound were performed to evaluate the lower extremity deep venous systems from the level of the common femoral vein and including the common femoral, femoral, profunda femoral, popliteal and calf veins including the posterior tibial, peroneal and gastrocnemius veins when visible. The superficial great saphenous vein was also interrogated. Spectral Doppler was utilized to evaluate flow at rest and with distal augmentation maneuvers in the common femoral, femoral and popliteal veins. COMPARISON:  None. FINDINGS: Contralateral Common Femoral Vein: Respiratory phasicity is normal and symmetric with the symptomatic side. No evidence of thrombus. Normal compressibility. Common Femoral Vein: No evidence of thrombus. Normal compressibility, respiratory phasicity  and response to augmentation. Saphenofemoral Junction: No evidence of thrombus. Normal compressibility and flow on color Doppler imaging. Profunda Femoral Vein: No evidence of thrombus. Normal compressibility and flow on color Doppler imaging. Femoral Vein: No evidence of thrombus. Normal compressibility, respiratory phasicity and response to augmentation. Popliteal Vein: No evidence of thrombus. Normal compressibility, respiratory phasicity and response to augmentation. Calf Veins: Posterior tibial vein appears patent. Hypoechoic thrombus present in the calf peroneal vein. Peroneal vein is noncompressible. Overall minor calf burden. No significant propagation into the popliteal or femoral veins. Superficial Great Saphenous Vein: No evidence of thrombus. Normal compressibility. Venous Reflux:  None. Other Findings:  None. IMPRESSION: Isolated calf posterior tibial DVT. Very low thrombus burden. No propagation into the popliteal or femoral veins. Electronically Signed   By: Jerilynn Mages.  Shick M.D.   On: 03/03/2018 16:18      Procedures Procedures (including critical care time)  Medications Ordered in ED Medications  rivaroxaban (XARELTO) Education Kit for DVT/PE patients (has no administration in time range)  cloNIDine (CATAPRES) tablet 0.1 mg (0.1 mg Oral Given 03/03/18 1418)    Initial Impression / Assessment and Plan / ED Course  Triage vital signs and the nursing notes have been reviewed.  Pertinent labs & imaging results that were available during care of the patient were reviewed and considered in medical decision making (see chart for details).  Clinical Course as of Mar 03 1712  Wed Mar 03, 2018  1433 Labs reassuring. All are WNL.   [GM]  1433 EKG shows NSR. No ST changes or signs of acute ischemia or infarct. In conjunction with negative troponin, decrease likely that patient is experiencing an acute cardiac event.   [GM]  9417 DVT seen on U/S. Continues to be well appearing and is not in acute distress. BP has improved appropriately with the administration of clonidine. Pt is appropriate for PO anticoagulation therapy. Denies chest pain, cough, SOB or other symptoms to suggest PE. Has had normal HR, RR and oxygen sats throughout this visit.  US Venous Img Lower Unilateral Right [GM]  1711 Pt's elevated BP responded appropriately to clonodine 0.75m x1. Pt advised to continue home BP meds as prescribed and to follow-up with PCP soon about BP. BP 154/82 prior to discharge.   [GM]    Clinical Course User Index [GM] MRomie Jumper [place]  Patient presented in no acute distress and well appearing. BP was initially elevated upon arrival at 184/103, but patient did not have any s/s to suggest end-organ damage, stroke or ACE. Physical exam was unremarkable and labs were WNL. U/S revealed DVT in left lower extremity. Pt does not have any other risk factors or symptoms to suggest PE. Pt is appropriate for PO anticoagulation therapy with Xarelto. Xarelto education kit provided to patient. Patient  advised to follow-up with PCP within 1 month to discuss recent ED visit and BP.  Final Clinical Impressions(s) / ED Diagnoses  1. DVT. No additional risk factors or s/s to suggest PE. Will discharge on Xarelto. Education provided by pharmacist and this provider on Xarelto administration and appropriate follow-up.  Dispo: Home. After thorough clinical evaluation, this patient is determined to be medically stable and can be safely discharged with the previously mentioned treatment and/or outpatient follow-up/referral(s). At this time, there are no other apparent medical conditions that require further screening, evaluation or treatment.  Final diagnoses:  Calf pain  Acute deep vein thrombosis (DVT) of tibial vein of right lower extremity ([facility]    ED  Discharge Orders        Ordered    Rivaroxaban 15 & 20 MG TBPK     03/03/18 [address] 03/03/18 1715    Quintella Reichert, MD 03/06/18 [phone]

## 2018-03-04 MED FILL — XARELTO STARTER PACK: 15 & 20 | 28 days supply | Qty: 51 | Fill #0

## 2018-03-11 ENCOUNTER — Other Ambulatory Visit: Payer: Self-pay

## 2018-03-11 ENCOUNTER — Emergency Department (HOSPITAL_BASED_OUTPATIENT_CLINIC_OR_DEPARTMENT_OTHER)
Admission: EM | Admit: 2018-03-11 | Discharge: 2018-03-11 | Disposition: A | Discharge status: Home / Self Care | Payer: Medicare Other | Attending: Emergency Medicine | Admitting: Emergency Medicine

## 2018-03-11 ENCOUNTER — Encounter (HOSPITAL_BASED_OUTPATIENT_CLINIC_OR_DEPARTMENT_OTHER): Payer: Self-pay | Admitting: *Deleted

## 2018-03-11 DIAGNOSIS — Z79899 Other long term (current) drug therapy: Secondary | ICD-10-CM | POA: Insufficient documentation

## 2018-03-11 DIAGNOSIS — F0150 Vascular dementia without behavioral disturbance: Secondary | ICD-10-CM | POA: Insufficient documentation

## 2018-03-11 DIAGNOSIS — Z7901 Long term (current) use of anticoagulants: Secondary | ICD-10-CM | POA: Insufficient documentation

## 2018-03-11 DIAGNOSIS — R319 Hematuria, unspecified: Secondary | ICD-10-CM | POA: Diagnosis present

## 2018-03-11 DIAGNOSIS — Z8673 Personal history of transient ischemic attack (TIA), and cerebral infarction without residual deficits: Secondary | ICD-10-CM | POA: Insufficient documentation

## 2018-03-11 DIAGNOSIS — I10 Essential (primary) hypertension: Secondary | ICD-10-CM | POA: Diagnosis not present

## 2018-03-11 LAB — COMPREHENSIVE METABOLIC PANEL
ALK PHOS: 70 U/L (ref 38–126)
ALT: 22 U/L (ref 14–54)
AST: 23 U/L (ref 15–41)
Albumin: 4 g/dL (ref 3.5–5.0)
Anion gap: 9 (ref 5–15)
BUN: 16 mg/dL (ref 6–20)
CALCIUM: 8.9 mg/dL (ref 8.9–10.3)
CHLORIDE: 105 mmol/L (ref 101–111)
CO2: 26 mmol/L (ref 22–32)
CREATININE: 0.76 mg/dL (ref 0.44–1.00)
Glucose, Bld: 88 mg/dL (ref 65–99)
Potassium: 4.1 mmol/L (ref 3.5–5.1)
Sodium: 140 mmol/L (ref 135–145)
Total Bilirubin: 0.5 mg/dL (ref 0.3–1.2)
Total Protein: 7.3 g/dL (ref 6.5–8.1)

## 2018-03-11 LAB — URINALYSIS, ROUTINE W REFLEX MICROSCOPIC
BILIRUBIN URINE: NEGATIVE
Glucose, UA: NEGATIVE mg/dL
KETONES UR: NEGATIVE mg/dL
NITRITE: NEGATIVE
PROTEIN: NEGATIVE mg/dL
Specific Gravity, Urine: <1.005 — ABNORMAL LOW (ref 1.005–1.030)
pH: 6.5 (ref 5.0–8.0)

## 2018-03-11 LAB — CBC WITH DIFFERENTIAL/PLATELET
BASOS PCT: 0 %
Basophils Absolute: 0 10*3/uL (ref 0.0–0.1)
EOS ABS: 0.1 10*3/uL (ref 0.0–0.7)
EOS PCT: 2 %
HCT: 38.7 % (ref 36.0–46.0)
Hemoglobin: 13.4 g/dL (ref 12.0–15.0)
LYMPHS ABS: 1.3 10*3/uL (ref 0.7–4.0)
Lymphocytes Relative: 21 %
MCH: 31.8 pg (ref 26.0–34.0)
MCHC: 34.6 g/dL (ref 30.0–36.0)
MCV: 91.9 fL (ref 78.0–100.0)
MONOS PCT: 8 %
Monocytes Absolute: 0.5 10*3/uL (ref 0.1–1.0)
NEUTROS PCT: 69 %
Neutro Abs: 4.2 10*3/uL (ref 1.7–7.7)
PLATELETS: 180 10*3/uL (ref 150–400)
RBC: 4.21 MIL/uL (ref 3.87–5.11)
RDW: 12.4 % (ref 11.5–15.5)
WBC: 6.1 10*3/uL (ref 4.0–10.5)

## 2018-03-11 LAB — URINALYSIS, MICROSCOPIC (REFLEX)

## 2018-03-11 MED ORDER — ACETAMINOPHEN 325 MG PO TABS
650.0000 mg | ORAL_TABLET | Freq: Once | ORAL | Status: AC
  Administered 2018-03-11: 650 mg via ORAL
  Filled 2018-03-11: qty 2

## 2018-03-11 NOTE — ED Provider Notes (Signed)
[facility] EMERGENCY DEPARTMENT Provider Note   CSN: [id number] Arrival date & time: 03/11/18  1418     History   Chief Complaint Chief Complaint  Patient presents with  . Hematuria    HPI Olivia Sewerrnestine [name] is a 67 y.o. female with PMH of HTN, vascular dementia, stroke, DVT of LLE on 6/5 on Xarelto here for hematuria   Patient notes that last night she had an episode of light yellow urine. This morning when she went to urinate her urine was darker red. She notes that when she wiped she saw blood on the toilet paper as well. She notes that she has had normal appearing urine since this morning. Admits to some suprapubic pain but otherwise no CVA tenderness or abdominal pain. Denies any nausea, vomiting, diarrhea, constipation. She is unsure if she has vaginal bleeding or just blood in her urine.   Of note she was recently started on Xarelto on 6/5 for a LLE DVT. Denies ever having hematuria before.    HPI  Past Medical History:  Diagnosis Date  . Hypertension   . Hypoglycemia   . Stroke [facility](HCC)     Patient Active Problem List   Diagnosis Date Noted  . Hypertensive urgency 03/27/2014  . Left arm weakness 03/27/2014  . Headache(784.0) 03/27/2014  . UTI (urinary tract infection) 03/27/2014  . Low back pain 03/27/2014  . Muscle strain, multiple sites 07/24/2011  . Right ankle sprain 07/24/2011  . Wrist sprain 07/24/2011    Past Surgical History:  Procedure Laterality Date  . ABDOMINAL HYSTERECTOMY    . HERNIA REPAIR    . TUBAL LIGATION       OB History   None      Home Medications    Prior to Admission medications   Medication Sig Start Date End Date Taking? Authorizing Provider  amLODipine (NORVASC) 5 MG tablet Take 1 tablet (5 mg total) by mouth daily. 03/28/14   Esperanza SheetsBuriev, Ulugbek N, MD  aspirin EC 81 MG tablet Take 1 tablet (81 mg total) by mouth daily. 03/28/14   Esperanza SheetsBuriev, Ulugbek N, MD  B Complex-C (B-COMPLEX WITH VITAMIN C) tablet Take 1 tablet by  mouth daily.    [provider]  chlorthalidone (HYGROTON) 25 MG tablet Take 25 mg by mouth daily.    [provider]  cholecalciferol (VITAMIN D) 1000 UNITS tablet Take 1,000 Units by mouth daily.    [provider]  MAGNESIUM PO Take 1 tablet by mouth daily.    [provider]  potassium gluconate 595 MG TABS tablet Take 595 mg by mouth daily.    [provider]  Pyridoxine HCl (VITAMIN B-6 PO) Take 1 tablet by mouth daily.     [provider]  Rivaroxaban 15 & 20 MG TBPK Take as directed on package: Start with one 15mg  tablet by mouth twice a day with food. On Day 22, switch to one 20mg  tablet once a day with food. 03/03/18   Mortis, Jerrel IvoryGabrielle I, PA-C  VITAMIN E PO Take 1 tablet by mouth daily.    [provider]    Family History Family History  Problem Relation Age of Onset  . Hypertension Mother   . Diabetes Sister   . Hypertension Brother   . Hypertension Maternal Aunt   . Heart attack Neg Hx   . Hyperlipidemia Neg Hx   . Sudden death Neg Hx     Social History Social History   Tobacco Use  . Smoking status:  Never Smoker  . Smokeless tobacco: Never Used  Substance Use Topics  . Alcohol use: No  . Drug use: No     Allergies   Patient has no known allergies.   Review of Systems Review of Systems: see HPI. Otherwise negative   Physical Exam Updated Vital Signs BP (!) 160/95   Pulse 73   Temp 98 F (36.7 C) (Oral)   Resp 18   Ht 5\' 9"  (1.753 m)   Wt 106.1 kg (234 lb)   SpO2 98%   BMI 34.56 kg/m   Physical Exam  Constitutional: She is oriented to person, place, and time. She appears well-developed and well-nourished.  HENT:  Head: Normocephalic.  Right Ear: External ear normal.  Left Ear: External ear normal.  Nose: Nose normal.  Mouth/Throat: Oropharynx is clear and moist.  Eyes: Pupils are equal, round, and reactive to light. Conjunctivae are normal.  Neck: Normal range of motion.    Cardiovascular: Normal rate, regular rhythm and intact distal pulses.  Pulmonary/Chest: Effort normal and breath sounds normal. No respiratory distress. She has no wheezes.  Abdominal: Soft. Bowel sounds are normal. She exhibits no distension and no mass. There is tenderness (suprapubic).  Genitourinary:  Genitourinary Comments: External genitalia within normal limits.  Vaginal mucosa pink, moist, normal rugae. No vaginal bleeding or discharge  Musculoskeletal: Normal range of motion.  Neurological: She is alert and oriented to person, place, and time.  Skin: Skin is warm and dry. Capillary refill takes less than 2 seconds.  Psychiatric: She has a normal mood and affect. Her behavior is normal.     ED Treatments / Results  Labs (all labs ordered are listed, but only abnormal results are displayed) Labs Reviewed  CBC WITH DIFFERENTIAL/PLATELET  COMPREHENSIVE METABOLIC PANEL  URINALYSIS, ROUTINE W REFLEX MICROSCOPIC    EKG None  Radiology No results found.  Procedures Procedures (including critical care time)  Medications Ordered in ED Medications  acetaminophen (TYLENOL) tablet 650 mg (has no administration in time range)     Initial Impression / Assessment and Plan / ED Course  I have reviewed the triage vital signs and the nursing notes.  Pertinent labs & imaging results that were available during my care of the patient were reviewed by me and considered in my medical decision making (see chart for details).    67 year old female who has had hematuria for the last day in the setting of starting Xarelto 1 week ago for left lower extremity DVT.  Pelvic exam showing no vaginal bleeding.   UA, CBC, and CMP ordered. Patient signed out to Dr. Fayrene Fearing.   Final Clinical Impressions(s) / ED Diagnoses   Final diagnoses:  Hematuria, unspecified type    ED Discharge Orders    None       Beaulah Dinning, MD 03/11/18 1536    Rolland Porter, MD 03/20/18 [phone]

## 2018-03-11 NOTE — Discharge Instructions (Signed)
The blood in your urine is likely from your Xarelto. 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 This can pre-dispose to bleeding. I recommend that you recheck with [facility] Care [facility] (Dr. Maple HudsonYoung) within 2 weeks if blood persists. Recheck here with pain, weakness, lightheadness, or other changes.

## 2018-03-11 NOTE — ED Provider Notes (Signed)
Presents for evaluation of hematuria.  Recently diagnosed with lower extremity DVT and placed on Xarelto.  Painless hematuria today.  No dysuria hematuria frequency.  Not feel lightheaded weak or dizzy.  History per CT scan of horseshoe kidney no stone on CT scan 2017.  She has normal exam.  She does not appear pale.  She is not tachycardic or hypotensive.  If urine does not appear infected, and her hemoglobin is normal.  I would recommend expectant management with recheck with primary care within the next 2 weeks unless other symptoms present themselves.  If she develops pain, weakness, lightheadedness, then recheck sooner either with primary care, or here.  Her bleeding may be secondary to structural abnormal horseshoe kidney and Xarelto.     Olivia PorterJames, Olivia Gillie, MD 03/11/18 [phone]

## 2018-03-11 NOTE — ED Triage Notes (Signed)
She was started on Xarelto a week ago for DVT in her right leg. Here today for hematuria. C/o headache.

## 2018-03-12 LAB — URINE CULTURE

## 2019-09-30 ENCOUNTER — Emergency Department (HOSPITAL_BASED_OUTPATIENT_CLINIC_OR_DEPARTMENT_OTHER): Payer: Medicare Other

## 2019-09-30 ENCOUNTER — Emergency Department (HOSPITAL_BASED_OUTPATIENT_CLINIC_OR_DEPARTMENT_OTHER)
Admission: EM | Admit: 2019-09-30 | Discharge: 2019-09-30 | Disposition: A | Discharge status: Home / Self Care | Payer: Medicare Other | Attending: Emergency Medicine | Admitting: Emergency Medicine

## 2019-09-30 ENCOUNTER — Encounter (HOSPITAL_BASED_OUTPATIENT_CLINIC_OR_DEPARTMENT_OTHER): Payer: Self-pay | Admitting: Emergency Medicine

## 2019-09-30 ENCOUNTER — Other Ambulatory Visit: Payer: Self-pay

## 2019-09-30 DIAGNOSIS — U071 COVID-19: Secondary | ICD-10-CM | POA: Diagnosis not present

## 2019-09-30 DIAGNOSIS — R519 Headache, unspecified: Secondary | ICD-10-CM | POA: Diagnosis present

## 2019-09-30 DIAGNOSIS — R6883 Chills (without fever): Secondary | ICD-10-CM | POA: Insufficient documentation

## 2019-09-30 DIAGNOSIS — Z7982 Long term (current) use of aspirin: Secondary | ICD-10-CM | POA: Insufficient documentation

## 2019-09-30 DIAGNOSIS — Z8673 Personal history of transient ischemic attack (TIA), and cerebral infarction without residual deficits: Secondary | ICD-10-CM | POA: Diagnosis not present

## 2019-09-30 DIAGNOSIS — Z79899 Other long term (current) drug therapy: Secondary | ICD-10-CM | POA: Insufficient documentation

## 2019-09-30 DIAGNOSIS — I10 Essential (primary) hypertension: Secondary | ICD-10-CM | POA: Insufficient documentation

## 2019-09-30 DIAGNOSIS — R110 Nausea: Secondary | ICD-10-CM | POA: Diagnosis not present

## 2019-09-30 MED ORDER — ONDANSETRON 4 MG PO TBDP
4.0000 mg | ORAL_TABLET | Freq: Once | ORAL | Status: AC
  Administered 2019-09-30: 4 mg via ORAL
  Filled 2019-09-30: qty 1

## 2019-09-30 MED ORDER — ONDANSETRON 4 MG PO TBDP: 4.0000 mg | ORAL_TABLET | Freq: Three times a day (TID) | ORAL | 1 refills | Status: AC | PRN

## 2019-09-30 MED ORDER — ACETAMINOPHEN 325 MG PO TABS
650.0000 mg | ORAL_TABLET | Freq: Once | ORAL | Status: AC
  Administered 2019-09-30: 650 mg via ORAL
  Filled 2019-09-30: qty 2

## 2019-09-30 NOTE — ED Triage Notes (Signed)
Pt tested positive for Covid 2 days ago.  Just now got results. Sts she has a HA and nausea.  Came in to ED d/t to concern because she has had a stroke in the past and was sitting in the parking lot waiting for her husband to come out.  Was afraid she might get sick sitting out there.

## 2019-09-30 NOTE — Discharge Instructions (Addendum)
Chest x-ray negative.  But as you know you had a positive outpatient Covid test.  Take Tylenol as needed.  Prescription for Zofran provided to help with the nausea.  Return for any new or worse symptoms.  Self isolate.  Also recommend to keep taking a baby aspirin a day.

## 2019-09-30 NOTE — ED Provider Notes (Signed)
[facility] EMERGENCY DEPARTMENT Provider Note   CSN: [id number] Arrival date & time: 09/30/19  1008     History Chief Complaint  Patient presents with  . Covid+, HA, Nausea    Olivia [name] is a 69 y.o. female.  Patient's daughter and spouse positive for COVID-19 infection.  Patient started with symptoms like on Tuesday.  She had an outpatient test done on Wednesday.  Get results today of being positive.  Complaint of headache and nausea and the feeling of chills but no distinct fever slight cough.  No diarrhea.  No significant body aches.  Past medical history significant for hypertension stroke.  Patient has some baseline confusion.        Past Medical History:  Diagnosis Date  . Hypertension   . Hypoglycemia   . Stroke [facility])     Patient Active Problem List   Diagnosis Date Noted  . Hypertensive urgency 03/27/2014  . Left arm weakness 03/27/2014  . Headache(784.0) 03/27/2014  . UTI (urinary tract infection) 03/27/2014  . Low back pain 03/27/2014  . Muscle strain, multiple sites 07/24/2011  . Right ankle sprain 07/24/2011  . Wrist sprain 07/24/2011    Past Surgical History:  Procedure Laterality Date  . ABDOMINAL HYSTERECTOMY    . HERNIA REPAIR    . TUBAL LIGATION       OB History   No obstetric history on file.     Family History  Problem Relation Age of Onset  . Hypertension Mother   . Diabetes Sister   . Hypertension Brother   . Hypertension Maternal Aunt   . Heart attack Neg Hx   . Hyperlipidemia Neg Hx   . Sudden death Neg Hx     Social History   Tobacco Use  . Smoking status: Never Smoker  . Smokeless tobacco: Never Used  Substance Use Topics  . Alcohol use: No  . Drug use: No    Home Medications Prior to Admission medications   Medication Sig Start Date End Date Taking? Authorizing Provider  amLODipine (NORVASC) 5 MG tablet Take 1 tablet (5 mg total) by mouth daily. 03/28/14   Kinnie Feil, MD  aspirin EC 81 MG  tablet Take 1 tablet (81 mg total) by mouth daily. 03/28/14   Kinnie Feil, MD  B Complex-C (B-COMPLEX WITH VITAMIN C) tablet Take 1 tablet by mouth daily.    [provider]  chlorthalidone (HYGROTON) 25 MG tablet Take 25 mg by mouth daily.    [provider]  cholecalciferol (VITAMIN D) 1000 UNITS tablet Take 1,000 Units by mouth daily.    [provider]  MAGNESIUM PO Take 1 tablet by mouth daily.    [provider]  ondansetron (ZOFRAN ODT) 4 MG disintegrating tablet Take 1 tablet (4 mg total) by mouth every 8 (eight) hours as needed. 09/30/19   Fredia Sorrow, MD  potassium gluconate 595 MG TABS tablet Take 595 mg by mouth daily.    [provider]  Pyridoxine HCl (VITAMIN B-6 PO) Take 1 tablet by mouth daily.     [provider]  Rivaroxaban 15 & 20 MG TBPK Take as directed on package: Start with one 15mg  tablet by mouth twice a day with food. On Day 22, switch to one 20mg  tablet once a day with food. 03/03/18   Mortis, Alvie Heidelberg I, PA-C  VITAMIN E PO Take 1 tablet by mouth daily.    [provider]    Allergies    Patient  has no known allergies.  Review of Systems   Review of Systems  Constitutional: Positive for chills. Negative for fever.  HENT: Negative for rhinorrhea and sore throat.   Eyes: Negative for visual disturbance.  Respiratory: Positive for cough. Negative for shortness of breath.   Cardiovascular: Negative for chest pain and leg swelling.  Gastrointestinal: Positive for nausea. Negative for abdominal pain, diarrhea and vomiting.  Genitourinary: Negative for dysuria.  Musculoskeletal: Negative for back pain and neck pain.  Skin: Negative for rash.  Neurological: Positive for headaches. Negative for dizziness and light-headedness.  Hematological: Does not bruise/bleed easily.  Psychiatric/Behavioral: Negative for confusion.    Physical Exam Updated Vital Signs BP (!) 147/82 (BP Location: Right Arm)    Pulse 74   Temp 99.3 F (37.4 C) (Oral)   Resp 16   Ht 1.753 m (5\' 9" )   Wt 112 kg   SpO2 96%   BMI 36.46 kg/m   Physical Exam Vitals and nursing note reviewed.  Constitutional:      General: She is not in acute distress.    Appearance: Normal appearance. She is well-developed.  HENT:     Head: Normocephalic and atraumatic.  Eyes:     Extraocular Movements: Extraocular movements intact.     Conjunctiva/sclera: Conjunctivae normal.     Pupils: Pupils are equal, round, and reactive to light.  Cardiovascular:     Rate and Rhythm: Normal rate and regular rhythm.     Heart sounds: No murmur.  Pulmonary:     Effort: Pulmonary effort is normal. No respiratory distress.     Breath sounds: Normal breath sounds.  Abdominal:     Palpations: Abdomen is soft.     Tenderness: There is no abdominal tenderness.  Musculoskeletal:        General: No swelling. Normal range of motion.     Cervical back: Normal range of motion and neck supple.  Skin:    General: Skin is warm and dry.  Neurological:     Mental Status: She is alert. Mental status is at baseline.     Cranial Nerves: No cranial nerve deficit.     Motor: No weakness.     ED Results / Procedures / Treatments   Labs (all labs ordered are listed, but only abnormal results are displayed) Labs Reviewed - No data to display  EKG None  Radiology DG Chest [facility] 1 View  Result Date: 09/30/2019 CLINICAL DATA:  COVID positive. EXAM: PORTABLE CHEST 1 VIEW COMPARISON:  None FINDINGS: Cardiac enlargement, stable. Both lungs are clear. The visualized skeletal structures are unremarkable. IMPRESSION: 1. No active cardiopulmonary abnormalities. 2. Stable cardiac enlargement. Electronically Signed   By: 11/28/2019 M.D.   On: 09/30/2019 11:00    Procedures Procedures (including critical care time)  Medications Ordered in ED Medications  acetaminophen (TYLENOL) tablet 650 mg (has no administration in time range)  ondansetron  (ZOFRAN-ODT) disintegrating tablet 4 mg (has no administration in time range)    ED Course  I have reviewed the triage vital signs and the nursing notes.  Pertinent labs & imaging results that were available during my care of the patient were reviewed by me and considered in my medical decision making (see chart for details).    MDM Rules/Calculators/A&P                       Patient with outpatient Covid test positive.  Patient without any complicating factors at this point time requiring  admission.  Chest x-ray negative for any significant abnormalities.  No significant fever no significant tachycardia or tachypnea.  Oxygen sats are 96% on room air.  Patient will be treated symptomatically.  Stable for discharge home.  Patient with some baseline confusion following previous stroke.  Nursing will contact daughter.  Since she was brought here with her husband who will probably require admission for COVID-19 symptoms   Final Clinical Impression(s) / ED Diagnoses Final diagnoses:  COVID-19 virus infection    Rx / DC Orders ED Discharge Orders         Ordered    ondansetron (ZOFRAN ODT) 4 MG disintegrating tablet  Every 8 hours PRN     09/30/19 1125           Vanetta Mulders, MD 09/30/19 1129

## 2023-01-13 ENCOUNTER — Encounter (HOSPITAL_BASED_OUTPATIENT_CLINIC_OR_DEPARTMENT_OTHER): Payer: Self-pay | Admitting: Emergency Medicine

## 2023-01-13 ENCOUNTER — Other Ambulatory Visit: Payer: Self-pay

## 2023-01-13 ENCOUNTER — Emergency Department (HOSPITAL_BASED_OUTPATIENT_CLINIC_OR_DEPARTMENT_OTHER): Payer: Medicare Other

## 2023-01-13 ENCOUNTER — Emergency Department (HOSPITAL_BASED_OUTPATIENT_CLINIC_OR_DEPARTMENT_OTHER)
Admission: EM | Admit: 2023-01-13 | Discharge: 2023-01-13 | Disposition: A | Discharge status: Home / Self Care | Payer: Medicare Other | Attending: Emergency Medicine | Admitting: Emergency Medicine

## 2023-01-13 DIAGNOSIS — Z79899 Other long term (current) drug therapy: Secondary | ICD-10-CM | POA: Insufficient documentation

## 2023-01-13 DIAGNOSIS — I10 Essential (primary) hypertension: Secondary | ICD-10-CM | POA: Insufficient documentation

## 2023-01-13 DIAGNOSIS — Z7982 Long term (current) use of aspirin: Secondary | ICD-10-CM | POA: Diagnosis not present

## 2023-01-13 DIAGNOSIS — M79604 Pain in right leg: Secondary | ICD-10-CM | POA: Insufficient documentation

## 2023-01-13 MED ORDER — OXYCODONE HCL 5 MG PO TABS
5.0000 mg | ORAL_TABLET | Freq: Once | ORAL | Status: AC
  Administered 2023-01-13: 5 mg via ORAL
  Filled 2023-01-13: qty 1

## 2023-01-13 MED ORDER — OXYCODONE HCL 5 MG PO TABS: 5.0000 mg | ORAL_TABLET | Freq: Once | ORAL | Status: DC

## 2023-01-13 MED ORDER — METHOCARBAMOL 500 MG PO TABS: 500.0000 mg | ORAL_TABLET | Freq: Two times a day (BID) | ORAL | 0 refills | Status: DC

## 2023-01-13 NOTE — ED Notes (Signed)
To [place] via w/c before that up to [facility] ambulatory refused w/c

## 2023-01-13 NOTE — Discharge Instructions (Addendum)
We evaluated you for your leg pain.  We obtained an x-ray of your leg which did not show any broken bones or problems with your knee replacement.  Your ultrasound did not show any signs of blood clots in your leg.  We did not see any signs of an infection on your physical examination, and your foot had strong pulses, so the blood flow in your leg is intact.  We are not quite sure what is causing your leg pain, but since your workup is reassuring, we think it is safe to go home and follow-up with your primary doctor.  It is possible could be a muscle spasm or strain so we have prescribed you a muscle relaxer.  Please do not mix this with alcohol or drive while taking this medication, it may make you sleepy.  Please take Tylenol and Motrin for your symptoms at home.  You can take 1000 mg of Tylenol every 6 hours and 600 mg of ibuprofen every 6 hours as needed for your symptoms.  You can take these medicines together as needed, either at the same time, or alternating every 3 hours.  If you develop any new or worsening symptoms, uncontrolled pain, leg swelling, redness, fevers or chills, or any other concerning symptoms, please return to the emergency department for repeat evaluation.

## 2023-01-13 NOTE — ED Provider Notes (Signed)
[facility] Provider Note  CSN: [id number] Arrival date & time: 01/13/23 1046  Chief Complaint(s) Knee Pain  HPI Olivia [name] is a 72 y.o. female with history of hypertension, prior stroke presenting to the emergency department with right leg pain.  She reports leg pain for around 1 week.  Reports it is located in the right knee and posterior right lower leg.  She does report history of blood clots not on anticoagulation.  She denies any recent travel or surgeries.  No fevers or chills.  No abdominal pain.  No chest pain or shortness of breath.  No lightheadedness or dizziness.  She reports she has taken tylenol without relief of her symptoms.   Past Medical History Past Medical History:  Diagnosis Date   Hypertension    Hypoglycemia    Stroke    Patient Active Problem List   Diagnosis Date Noted   Hypertensive urgency 03/27/2014   Left arm weakness 03/27/2014   Headache(784.0) 03/27/2014   UTI (urinary tract infection) 03/27/2014   Low back pain 03/27/2014   Muscle strain, multiple sites 07/24/2011   Right ankle sprain 07/24/2011   Wrist sprain 07/24/2011   Home Medication(s) Prior to Admission medications   Medication Sig Start Date End Date Taking? Authorizing Provider  methocarbamol (ROBAXIN) 500 MG tablet Take 1 tablet (500 mg total) by mouth 2 (two) times daily. 01/13/23  Yes Lonell Grandchild, MD  amLODipine (NORVASC) 5 MG tablet Take 1 tablet (5 mg total) by mouth daily. 03/28/14   Esperanza Sheets, MD  aspirin EC 81 MG tablet Take 1 tablet (81 mg total) by mouth daily. 03/28/14   Esperanza Sheets, MD  B Complex-C (B-COMPLEX WITH VITAMIN C) tablet Take 1 tablet by mouth daily.    [provider]  chlorthalidone (HYGROTON) 25 MG tablet Take 25 mg by mouth daily.    [provider]  cholecalciferol (VITAMIN D) 1000 UNITS tablet Take 1,000 Units by mouth daily.    [provider]  MAGNESIUM PO  Take 1 tablet by mouth daily.    [provider]  ondansetron (ZOFRAN ODT) 4 MG disintegrating tablet Take 1 tablet (4 mg total) by mouth every 8 (eight) hours as needed. 09/30/19   Vanetta Mulders, MD  potassium gluconate 595 MG TABS tablet Take 595 mg by mouth daily.    [provider]  Pyridoxine HCl (VITAMIN B-6 PO) Take 1 tablet by mouth daily.     [provider]  Rivaroxaban 15 & 20 MG TBPK Take as directed on package: Start with one  tablet by mouth twice a day with food. On Day 22, switch to one  tablet once a day with food. 03/03/18   Mortis, Jerrel Ivory I, PA-C  VITAMIN E PO Take 1 tablet by mouth daily.    [provider]  Past Surgical History Past Surgical History:  Procedure Laterality Date   ABDOMINAL HYSTERECTOMY     HERNIA REPAIR     TUBAL LIGATION     Family History Family History  Problem Relation Age of Onset   Hypertension Mother    Diabetes Sister    Hypertension Brother    Hypertension Maternal Aunt    Heart attack Neg Hx    Hyperlipidemia Neg Hx    Sudden death Neg Hx     Social History Social History   Tobacco Use   Smoking status: Never   Smokeless tobacco: Never  Substance Use Topics   Alcohol use: No   Drug use: No   Allergies Patient has no known allergies.  Review of Systems Review of Systems  All other systems reviewed and are negative.   Physical Exam Vital Signs  I have reviewed the triage vital signs BP 124/84 (BP Location: Left Arm)   Pulse 65   Temp 98.2 F (36.8 C) (Oral)   Resp 17   Ht 5\' 9"  (1.753 m)   Wt 112 kg   SpO2 98%   BMI 36.46 kg/m  Physical Exam Vitals and nursing note reviewed.  Constitutional:      General: She is not in acute distress.    Appearance: She is well-developed.  HENT:     Head: Normocephalic and atraumatic.     Mouth/Throat:      Mouth: Mucous membranes are moist.  Eyes:     Pupils: Pupils are equal, round, and reactive to light.  Cardiovascular:     Rate and Rhythm: Normal rate and regular rhythm.     Heart sounds: No murmur heard. Pulmonary:     Effort: Pulmonary effort is normal. No respiratory distress.     Breath sounds: Normal breath sounds.  Abdominal:     General: Abdomen is flat.     Palpations: Abdomen is soft.     Tenderness: There is no abdominal tenderness.  Musculoskeletal:        General: Tenderness (R calf and posterior knee) present.     Right lower leg: No edema.     Left lower leg: No edema.  Skin:    General: Skin is warm and dry.  Neurological:     General: No focal deficit present.     Mental Status: She is alert. Mental status is at baseline.  Psychiatric:        Mood and Affect: Mood normal.        Behavior: Behavior normal.     ED Results and Treatments Labs (all labs ordered are listed, but only abnormal results are displayed) Labs Reviewed - No data to display                                                                                                                        Radiology US Venous Img Lower Right (DVT Study)  Result Date: 01/13/2023 CLINICAL DATA:  Right lower extremity pain for the  past week. Foot numbness. Evaluate for DVT. EXAM: RIGHT LOWER EXTREMITY VENOUS DOPPLER ULTRASOUND TECHNIQUE: Gray-scale sonography with graded compression, as well as color Doppler and duplex ultrasound were performed to evaluate the lower extremity deep venous systems from the level of the common femoral vein and including the common femoral, femoral, profunda femoral, popliteal and calf veins including the posterior tibial, peroneal and gastrocnemius veins when visible. The superficial great saphenous vein was also interrogated. Spectral Doppler was utilized to evaluate flow at rest and with distal augmentation maneuvers in the common femoral, femoral and popliteal veins.  COMPARISON:  None Available. FINDINGS: Contralateral Common Femoral Vein: Respiratory phasicity is normal and symmetric with the symptomatic side. No evidence of thrombus. Normal compressibility. Common Femoral Vein: No evidence of thrombus. Normal compressibility, respiratory phasicity and response to augmentation. Saphenofemoral Junction: No evidence of thrombus. Normal compressibility and flow on color Doppler imaging. Profunda Femoral Vein: No evidence of thrombus. Normal compressibility and flow on color Doppler imaging. Femoral Vein: No evidence of thrombus. Normal compressibility, respiratory phasicity and response to augmentation. Popliteal Vein: No evidence of thrombus. Normal compressibility, respiratory phasicity and response to augmentation. Calf Veins: No evidence of thrombus. Normal compressibility and flow on color Doppler imaging. Superficial Great Saphenous Vein: No evidence of thrombus. Normal compressibility. Other Findings:  None. IMPRESSION: No evidence of DVT within the right lower extremity. Electronically Signed   By: Simonne Come M.D.   On: 01/13/2023 14:25   DG Knee Complete 4 Views Right  Result Date: 01/13/2023 CLINICAL DATA:  Right knee pain for the past week. EXAM: RIGHT KNEE - COMPLETE 4+ VIEW COMPARISON:  None Available. FINDINGS: Prior right total knee arthroplasty. No evidence of hardware failure or loosening. No acute fracture or dislocation. No significant joint effusion. Soft tissues are unremarkable. IMPRESSION: 1. Right total knee arthroplasty without hardware complication. Electronically Signed   By: Obie Dredge M.D.   On: 01/13/2023 11:45    Pertinent labs & imaging results that were available during my care of the patient were reviewed by me and considered in my medical decision making (see MDM for details).  Medications Ordered in ED Medications  oxyCODONE (Oxy IR/ROXICODONE) immediate release tablet 5 mg (has no administration in time range)  oxyCODONE (Oxy  IR/ROXICODONE) immediate release tablet 5 mg (5 mg Oral Given 01/13/23 1222)                                                                                                                                     Procedures Procedures  (including critical care time)  Medical Decision Making / ED Course   MDM:  72 year old female presenting to the emergency department with right knee and lower leg pain  Patient well-appearing, physical exam with no edema but she does have some tenderness to the posterior right leg.  No erythema or warmth to suggest cellulitis.  Range of motion normal of the knee.  Distal  pulses intact.  Differential includes musculoskeletal pain, DVT, Baker's cyst.  No evidence of arterial occlusion.  No sign of infection.  X-ray is normal and no deformity or trauma.  No back pain.  Will reassess.  Clinical Course as of 01/13/23 1456  Tue Jan 13, 2023  1454 Patient feeling better.  Still having a little bit of ongoing pains we will give additional dose of oxycodone.  She did not drive.  Unclear cause of pain, but no evidence of DVT, arterial thrombus, cellulitis or infection, fracture, compartment soft.  Will prescribe a muscle relaxer.  Advised over-the-counter pain control and close follow-up with PMD. Will discharge patient to home. All questions answered. Patient comfortable with plan of discharge. Return precautions discussed with patient and specified on the after visit summary.  [WS]    Clinical Course User Index [WS] Lonell Grandchild, MD     Additional history obtained: -Additional history obtained from family -External records from outside source obtained and reviewed including: Chart review including previous notes, labs, imaging, consultation notes including outside labs including creatinine  Imaging Studies ordered: I ordered imaging studies including DVT US RLE, XR knee On my interpretation imaging demonstrates no acute process I independently visualized  and interpreted imaging. I agree with the radiologist interpretation   Medicines ordered and prescription drug management: Meds ordered this encounter  Medications   oxyCODONE (Oxy IR/ROXICODONE) immediate release tablet 5 mg   oxyCODONE (Oxy IR/ROXICODONE) immediate release tablet 5 mg   methocarbamol (ROBAXIN) 500 MG tablet    Sig: Take 1 tablet (500 mg total) by mouth 2 (two) times daily.    Dispense:  20 tablet    Refill:  0    -I have reviewed the patients home medicines and have made adjustments as needed  Social Determinants of Health:  Diagnosis or treatment significantly limited by social determinants of health: obesity   Reevaluation: After the interventions noted above, I reevaluated the patient and found that their symptoms have improved  Co morbidities that complicate the patient evaluation  Past Medical History:  Diagnosis Date   Hypertension    Hypoglycemia    Stroke       Dispostion: Disposition decision including need for hospitalization was considered, and patient discharged from emergency department.    Final Clinical Impression(s) / ED Diagnoses Final diagnoses:  Right leg pain     This chart was dictated using voice recognition software.  Despite best efforts to proofread,  errors can occur which can change the documentation meaning.    Lonell Grandchild, MD 01/13/23 [phone]

## 2023-01-13 NOTE — ED Notes (Signed)
Husband is here and I gave her pain med

## 2023-01-13 NOTE — ED Triage Notes (Signed)
Rt knee painx 1 week and foot is  numb also same amt oif time , has hx  of knee replacement that side, pt is ambulatory to triage , has taken  tylenol but no help

## 2023-01-13 NOTE — ED Notes (Signed)
Pt requesting update on status, informed her she is waiting for [place] to be performed. Apologized for delay.

## 2024-07-27 ENCOUNTER — Encounter (HOSPITAL_BASED_OUTPATIENT_CLINIC_OR_DEPARTMENT_OTHER): Payer: Self-pay | Admitting: Emergency Medicine

## 2024-07-27 ENCOUNTER — Other Ambulatory Visit: Payer: Self-pay

## 2024-07-27 ENCOUNTER — Emergency Department (HOSPITAL_BASED_OUTPATIENT_CLINIC_OR_DEPARTMENT_OTHER)
Admission: EM | Admit: 2024-07-27 | Discharge: 2024-07-27 | Disposition: A | Discharge status: Home / Self Care | Attending: Emergency Medicine | Admitting: Emergency Medicine

## 2024-07-27 ENCOUNTER — Emergency Department (HOSPITAL_BASED_OUTPATIENT_CLINIC_OR_DEPARTMENT_OTHER)

## 2024-07-27 DIAGNOSIS — K5730 Diverticulosis of large intestine without perforation or abscess without bleeding: Secondary | ICD-10-CM | POA: Insufficient documentation

## 2024-07-27 DIAGNOSIS — Q631 Lobulated, fused and horseshoe kidney: Secondary | ICD-10-CM | POA: Diagnosis not present

## 2024-07-27 DIAGNOSIS — R10A1 Flank pain, right side: Secondary | ICD-10-CM | POA: Insufficient documentation

## 2024-07-27 DIAGNOSIS — I10 Essential (primary) hypertension: Secondary | ICD-10-CM | POA: Insufficient documentation

## 2024-07-27 DIAGNOSIS — I700 Atherosclerosis of aorta: Secondary | ICD-10-CM | POA: Diagnosis not present

## 2024-07-27 DIAGNOSIS — I708 Atherosclerosis of other arteries: Secondary | ICD-10-CM | POA: Diagnosis not present

## 2024-07-27 DIAGNOSIS — R8290 Unspecified abnormal findings in urine: Secondary | ICD-10-CM | POA: Insufficient documentation

## 2024-07-27 LAB — URINALYSIS, W/ REFLEX TO CULTURE (INFECTION SUSPECTED)
Bilirubin Urine: NEGATIVE
Glucose, UA: NEGATIVE mg/dL
Hgb urine dipstick: NEGATIVE
Ketones, ur: NEGATIVE mg/dL
Nitrite: NEGATIVE
Protein, ur: 30 mg/dL — AB
Specific Gravity, Urine: 1.025 (ref 1.005–1.030)
pH: 6.5 (ref 5.0–8.0)

## 2024-07-27 MED ORDER — LIDOCAINE 5 % EX OINT: 1.0000 | TOPICAL_OINTMENT | CUTANEOUS | 0 refills | Status: AC | PRN

## 2024-07-27 MED ORDER — METHOCARBAMOL 500 MG PO TABS: 500.0000 mg | ORAL_TABLET | Freq: Three times a day (TID) | ORAL | 0 refills | Status: AC | PRN

## 2024-07-27 MED ORDER — LIDOCAINE 5 % EX PTCH
1.0000 | MEDICATED_PATCH | CUTANEOUS | Status: DC
  Administered 2024-07-27: 1 via TRANSDERMAL
  Filled 2024-07-27: qty 1

## 2024-07-27 NOTE — ED Triage Notes (Addendum)
 Right flank pain x 2 weeks , no urinary symptoms , no Hx kidney stone , no NV .  Reports pain is worse with movement . Pain started after she lifted a heavy box .

## 2024-07-27 NOTE — ED Provider Notes (Signed)
 Emergency Department Provider Note   I have reviewed the triage vital signs and the nursing notes.   HISTORY  Chief Complaint Flank Pain (right) and Back Pain   HPI Olivia [name] is a 73 y.o. female with past history below presents the emergency department with right flank pain for the past 2 weeks.  Pain is moderate but intermittently severe.  Pain does seem worse with movement.  Some radiation around to the right abdomen/flank but mostly in the mid back on the right.  No radiation of pain into the leg.  No numbness or weakness.  No urinary symptoms such as dysuria, hesitancy, urgency.  No fevers.  She has been taking Tylenol  at home with mild relief in symptoms but feels that overall the pain symptoms are worsening.  Past Medical History:  Diagnosis Date   Hypertension    Hypoglycemia    Stroke [facility])     Review of Systems  Constitutional: No fever/chills Cardiovascular: Denies chest pain. Respiratory: Denies shortness of breath. Gastrointestinal: Positive right flank/abdominal pain.  No nausea, no vomiting.  Musculoskeletal: Positive for back pain. Skin: Negative for rash. Neurological: Negative for headaches. ____________________________________________   PHYSICAL EXAM:  VITAL SIGNS: ED Triage Vitals  Encounter Vitals Group     BP 07/27/24 0934 (!) 175/100     Pulse Rate 07/27/24 0934 69     Resp 07/27/24 0934 16     Temp 07/27/24 0934 97.9 F (36.6 C)     Temp Source 07/27/24 0934 Oral     SpO2 07/27/24 0934 98 %     Weight 07/27/24 0929 224 lb (101.6 kg)   Constitutional: Alert and oriented. Well appearing and in no acute distress. Eyes: Conjunctivae are normal.  Head: Atraumatic. Nose: No congestion/rhinnorhea. Mouth/Throat: Mucous membranes are moist.  Neck: No stridor. Cardiovascular: Normal rate, regular rhythm. Good peripheral circulation. Grossly normal heart sounds.   Respiratory: Normal respiratory effort.  No retractions. Lungs  CTAB. Gastrointestinal: Soft and nontender. No distention.  Musculoskeletal: No gross deformities of extremities. Neurologic:  Normal speech and language. Skin:  Skin is warm, dry and intact. No rash noted.  ____________________________________________   LABS (all labs ordered are listed, but only abnormal results are displayed)  Labs Reviewed  URINALYSIS, W/ REFLEX TO CULTURE (INFECTION SUSPECTED) - Abnormal; Notable for the following components:      Result Value   Protein, ur 30 (*)    Leukocytes,Ua SMALL (*)    Bacteria, UA RARE (*)    All other components within normal limits  URINE CULTURE   ____________________________________________  RADIOLOGY  CT Renal Stone Study Result Date: 07/27/2024 EXAM: CT ABDOMEN AND PELVIS WITHOUT CONTRAST 07/27/2024 10:23:00 AM TECHNIQUE: CT of the abdomen and pelvis was performed without the administration of intravenous contrast. Multiplanar reformatted images are provided for review. Automated exposure control, iterative reconstruction, and/or weight-based adjustment of the mA/kV was utilized to reduce the radiation dose to as low as reasonably achievable. COMPARISON: CT of the abdomen and pelvis dated 02/18/2018. CLINICAL HISTORY: Abdominal/flank pain, stone suspected; right flank pain. Right flank pain x 2 weeks, no urinary symptoms, no Hx kidney stone, no NV. Reports pain is worse with movement. Pain started after she lifted a heavy box. FINDINGS: LOWER CHEST: There are streaky peribronchovascular opacities present medially within the right lower lobe. There is also mild bronchiectasis and bronchial wall thickening. There are some hazy and reticular opacities present anteriorly within the base of the left lower lobe. LIVER: The liver is unremarkable. GALLBLADDER  AND BILE DUCTS: Gallbladder is unremarkable. No biliary ductal dilatation. SPLEEN: No acute abnormality. PANCREAS: No acute abnormality. ADRENAL GLANDS: No acute abnormality. 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 No stones in the kidneys or ureters. No hydronephrosis. No perinephric or periureteral stranding. Urinary bladder is unremarkable. GI AND BOWEL: Stomach demonstrates no acute abnormality. There are scattered colonic diverticula. There is no bowel obstruction. PERITONEUM AND RETROPERITONEUM: No ascites. No free air. 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 There is mild-to-moderate calcific atheromatous disease also present within the abdominal aorta and iliac arteries. LYMPH NODES: No lymphadenopathy. REPRODUCTIVE ORGANS: The patient is status post hysterectomy and bilateral salpingo-oophorectomy. BONES AND SOFT TISSUES: No acute osseous abnormality. No focal soft tissue abnormality. IMPRESSION: 1. No acute findings in the abdomen or pelvis related to the clinical history of right flank pain. 2. 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 3. Horseshoe kidney. 4. Mild-to-moderate calcific atherosclerosis of the abdominal aorta and iliac arteries. 5. Scattered colonic diverticulosis. 6. Incidental chest base findings: Streaky peribronchovascular opacities in the right lower lobe with mild bronchiectasis/bronchial wall thickening, and hazy/reticular opacities in the anterior left lower lobe. Clinical correlation recommended. Electronically signed by: Evalene Coho MD 07/27/2024 11:29 AM EDT RP Workstation: [id number]    ____________________________________________   PROCEDURES  Procedure(s) performed:   Procedures  None  ____________________________________________   INITIAL IMPRESSION / ASSESSMENT AND PLAN / ED COURSE  Pertinent labs & imaging  results that were available during my care of the patient were reviewed by me and considered in my medical decision making (see chart for details).   This patient is Presenting for Evaluation of flank pain, which does require a range of treatment options, and is a complaint that involves a high risk of morbidity and mortality.  The Differential Diagnoses includes but is not exclusive to musculoskeletal back pain, renal colic, urinary tract infection, pyelonephritis, intra-abdominal causes of back pain, aortic aneurysm or dissection, cauda equina syndrome, sciatica, lumbar disc disease, thoracic disc disease, etc.   Reassessment after intervention: pain improved.   Clinical Laboratory Tests Ordered, included UA without infection.   Radiologic Tests Ordered, included CT renal. I independently interpreted the images and agree with radiology interpretation.   Medical Decision Making: Summary:  The patient presents emergency department right flank pain which seems musculoskeletal but given patient's age and progressively worsening symptoms over 2 weeks plan for CT renal along with UA.  Exceedingly low suspicion for vascular etiology to explain symptoms.   Reevaluation with update and discussion with patient. No bronchitis or PNA symptoms to correlate with CT lung base findings. Plan for [facility] pain mgmt and PCP follow up.   Patient's presentation is most consistent with acute presentation with potential threat to life or bodily function.   Disposition: discharge  ____________________________________________  FINAL CLINICAL IMPRESSION(S) / ED DIAGNOSES  Final diagnoses:  Right flank pain     NEW OUTPATIENT MEDICATIONS STARTED DURING THIS VISIT:  Discharge Medication List as of 07/27/2024 11:40 AM     START taking these medications   Details  lidocaine (XYLOCAINE) 5 % ointment Apply 1 Application topically as needed., Starting Wed 07/27/2024, Normal        Note:  This document was  prepared  using Conservation officer, historic buildings and may include unintentional dictation errors.  Fonda Law, MD, [facility]    Fabiana Dromgoole, Fonda MATSU, MD 07/28/24 [phone]

## 2024-07-27 NOTE — Discharge Instructions (Signed)
 You were seen the emergency room today with flank pain.  Your pain is likely musculoskeletal.  You can get over-the-counter lidocaine ointment or Voltaren gel to apply as needed.  I have also called in some Robaxin  which is a muscle relaxer.  This can make you drowsy and so I do not advise that you take it if you will be up and walking around or driving a car.  It is mainly to be used at night when you are getting some rest.

## 2024-07-28 LAB — URINE CULTURE: Culture: NO GROWTH
